# Patient Record
Sex: Female | Born: 1955 | Race: Black or African American | Hispanic: No | Smoking: Current every day smoker
Health system: Southern US, Community
[De-identification: ages and names within clinical notes are randomized; demographics above are authoritative.]

## PROBLEM LIST (undated history)

## (undated) DIAGNOSIS — I10 Essential (primary) hypertension: Secondary | ICD-10-CM

## (undated) DIAGNOSIS — F32A Depression, unspecified: Secondary | ICD-10-CM

## (undated) DIAGNOSIS — E785 Hyperlipidemia, unspecified: Secondary | ICD-10-CM

## (undated) DIAGNOSIS — G479 Sleep disorder, unspecified: Secondary | ICD-10-CM

## (undated) DIAGNOSIS — F329 Major depressive disorder, single episode, unspecified: Secondary | ICD-10-CM

## (undated) DIAGNOSIS — C539 Malignant neoplasm of cervix uteri, unspecified: Secondary | ICD-10-CM

## (undated) HISTORY — PX: ABDOMINAL HYSTERECTOMY: SHX81

## (undated) HISTORY — PX: BACK SURGERY: SHX140

## (undated) HISTORY — PX: TONSILLECTOMY: SUR1361

## (undated) HISTORY — DX: Sleep disorder, unspecified: G47.9

## (undated) HISTORY — PX: CERVICAL DISC SURGERY: SHX588

## (undated) HISTORY — DX: Essential (primary) hypertension: I10

## (undated) HISTORY — DX: Hyperlipidemia, unspecified: E78.5

## (undated) HISTORY — PX: OTHER SURGICAL HISTORY: SHX169

## (undated) HISTORY — PX: ELBOW SURGERY: SHX618

## (undated) HISTORY — DX: Depression, unspecified: F32.A

## (undated) HISTORY — DX: Major depressive disorder, single episode, unspecified: F32.9

## (undated) HISTORY — DX: Malignant neoplasm of cervix uteri, unspecified: C53.9

---

## 1981-11-17 DIAGNOSIS — C539 Malignant neoplasm of cervix uteri, unspecified: Secondary | ICD-10-CM

## 1981-11-17 HISTORY — DX: Malignant neoplasm of cervix uteri, unspecified: C53.9

## 2001-11-04 ENCOUNTER — Encounter: Admission: RE | Admit: 2001-11-04 | Discharge: 2001-11-04 | Payer: Self-pay | Admitting: Internal Medicine

## 2001-11-04 ENCOUNTER — Encounter: Payer: Self-pay | Admitting: Internal Medicine

## 2001-12-03 ENCOUNTER — Encounter (INDEPENDENT_AMBULATORY_CARE_PROVIDER_SITE_OTHER): Payer: Self-pay | Admitting: Specialist

## 2001-12-03 ENCOUNTER — Ambulatory Visit (HOSPITAL_COMMUNITY): Admission: RE | Admit: 2001-12-03 | Discharge: 2001-12-03 | Payer: Self-pay | Admitting: *Deleted

## 2001-12-15 ENCOUNTER — Encounter: Admission: RE | Admit: 2001-12-15 | Discharge: 2001-12-15 | Payer: Self-pay | Admitting: Internal Medicine

## 2001-12-15 ENCOUNTER — Encounter: Payer: Self-pay | Admitting: Internal Medicine

## 2005-05-22 ENCOUNTER — Emergency Department (HOSPITAL_COMMUNITY): Admission: EM | Admit: 2005-05-22 | Discharge: 2005-05-22 | Payer: Self-pay | Admitting: Emergency Medicine

## 2006-08-31 ENCOUNTER — Ambulatory Visit: Payer: Self-pay | Admitting: Family Medicine

## 2006-12-18 ENCOUNTER — Ambulatory Visit: Payer: Self-pay | Admitting: Family Medicine

## 2006-12-21 ENCOUNTER — Ambulatory Visit: Payer: Self-pay | Admitting: Family Medicine

## 2006-12-30 ENCOUNTER — Ambulatory Visit: Payer: Self-pay | Admitting: Family Medicine

## 2007-05-28 ENCOUNTER — Ambulatory Visit: Payer: Self-pay | Admitting: Family Medicine

## 2007-09-17 ENCOUNTER — Emergency Department (HOSPITAL_COMMUNITY): Admission: EM | Admit: 2007-09-17 | Discharge: 2007-09-17 | Payer: Self-pay | Admitting: Emergency Medicine

## 2007-12-24 ENCOUNTER — Ambulatory Visit: Payer: Self-pay | Admitting: Family Medicine

## 2008-04-14 ENCOUNTER — Ambulatory Visit: Payer: Self-pay | Admitting: Family Medicine

## 2008-09-11 ENCOUNTER — Ambulatory Visit: Payer: Self-pay | Admitting: Family Medicine

## 2008-10-09 ENCOUNTER — Ambulatory Visit: Payer: Self-pay | Admitting: Family Medicine

## 2009-05-17 ENCOUNTER — Ambulatory Visit: Payer: Self-pay | Admitting: Family Medicine

## 2009-06-11 ENCOUNTER — Ambulatory Visit: Payer: Self-pay | Admitting: Family Medicine

## 2010-01-07 ENCOUNTER — Ambulatory Visit: Payer: Self-pay | Admitting: Family Medicine

## 2010-01-28 ENCOUNTER — Ambulatory Visit: Payer: Self-pay | Admitting: Family Medicine

## 2010-04-08 ENCOUNTER — Ambulatory Visit: Payer: Self-pay | Admitting: Family Medicine

## 2010-04-12 ENCOUNTER — Encounter (INDEPENDENT_AMBULATORY_CARE_PROVIDER_SITE_OTHER): Payer: Self-pay

## 2010-09-19 ENCOUNTER — Ambulatory Visit: Payer: Self-pay | Admitting: Family Medicine

## 2010-10-24 ENCOUNTER — Inpatient Hospital Stay (HOSPITAL_COMMUNITY): Admission: EM | Admit: 2010-10-24 | Discharge: 2010-04-18 | Payer: Self-pay | Admitting: Emergency Medicine

## 2011-02-03 LAB — DIFFERENTIAL
Basophils Absolute: 0 10*3/uL (ref 0.0–0.1)
Basophils Relative: 0 % (ref 0–1)
Eosinophils Absolute: 0.1 10*3/uL (ref 0.0–0.7)
Eosinophils Relative: 1 % (ref 0–5)
Eosinophils Relative: 2 % (ref 0–5)
Lymphocytes Relative: 13 % (ref 12–46)
Lymphs Abs: 1.2 10*3/uL (ref 0.7–4.0)
Monocytes Absolute: 0.3 10*3/uL (ref 0.1–1.0)
Neutro Abs: 7.2 10*3/uL (ref 1.7–7.7)

## 2011-02-03 LAB — CBC
HCT: 29.6 % — ABNORMAL LOW (ref 36.0–46.0)
HCT: 32.3 % — ABNORMAL LOW (ref 36.0–46.0)
HCT: 41 % (ref 36.0–46.0)
HCT: 41 % (ref 36.0–46.0)
HCT: 43.2 % (ref 36.0–46.0)
Hemoglobin: 11.3 g/dL — ABNORMAL LOW (ref 12.0–15.0)
Hemoglobin: 12.9 g/dL (ref 12.0–15.0)
Hemoglobin: 14 g/dL (ref 12.0–15.0)
Hemoglobin: 14.1 g/dL (ref 12.0–15.0)
MCHC: 34.2 g/dL (ref 30.0–36.0)
MCHC: 34.2 g/dL (ref 30.0–36.0)
MCHC: 34.3 g/dL (ref 30.0–36.0)
MCHC: 34.4 g/dL (ref 30.0–36.0)
MCHC: 34.8 g/dL (ref 30.0–36.0)
MCV: 92 fL (ref 78.0–100.0)
MCV: 92.1 fL (ref 78.0–100.0)
MCV: 92.4 fL (ref 78.0–100.0)
MCV: 93 fL (ref 78.0–100.0)
MCV: 93.3 fL (ref 78.0–100.0)
Platelets: 314 10*3/uL (ref 150–400)
Platelets: 372 10*3/uL (ref 150–400)
RBC: 3.21 MIL/uL — ABNORMAL LOW (ref 3.87–5.11)
RBC: 3.93 MIL/uL (ref 3.87–5.11)
RBC: 4.36 MIL/uL (ref 3.87–5.11)
RBC: 4.39 MIL/uL (ref 3.87–5.11)
RBC: 4.41 MIL/uL (ref 3.87–5.11)
RBC: 4.69 MIL/uL (ref 3.87–5.11)
RDW: 12.4 % (ref 11.5–15.5)
RDW: 12.4 % (ref 11.5–15.5)
RDW: 12.8 % (ref 11.5–15.5)
WBC: 6 10*3/uL (ref 4.0–10.5)
WBC: 9.4 10*3/uL (ref 4.0–10.5)

## 2011-02-03 LAB — GLUCOSE, CAPILLARY
Glucose-Capillary: 104 mg/dL — ABNORMAL HIGH (ref 70–99)
Glucose-Capillary: 106 mg/dL — ABNORMAL HIGH (ref 70–99)
Glucose-Capillary: 106 mg/dL — ABNORMAL HIGH (ref 70–99)
Glucose-Capillary: 110 mg/dL — ABNORMAL HIGH (ref 70–99)
Glucose-Capillary: 116 mg/dL — ABNORMAL HIGH (ref 70–99)
Glucose-Capillary: 117 mg/dL — ABNORMAL HIGH (ref 70–99)
Glucose-Capillary: 118 mg/dL — ABNORMAL HIGH (ref 70–99)
Glucose-Capillary: 119 mg/dL — ABNORMAL HIGH (ref 70–99)
Glucose-Capillary: 120 mg/dL — ABNORMAL HIGH (ref 70–99)
Glucose-Capillary: 121 mg/dL — ABNORMAL HIGH (ref 70–99)
Glucose-Capillary: 122 mg/dL — ABNORMAL HIGH (ref 70–99)
Glucose-Capillary: 126 mg/dL — ABNORMAL HIGH (ref 70–99)
Glucose-Capillary: 131 mg/dL — ABNORMAL HIGH (ref 70–99)
Glucose-Capillary: 138 mg/dL — ABNORMAL HIGH (ref 70–99)
Glucose-Capillary: 146 mg/dL — ABNORMAL HIGH (ref 70–99)
Glucose-Capillary: 81 mg/dL (ref 70–99)
Glucose-Capillary: 89 mg/dL (ref 70–99)
Glucose-Capillary: 90 mg/dL (ref 70–99)
Glucose-Capillary: 92 mg/dL (ref 70–99)
Glucose-Capillary: 98 mg/dL (ref 70–99)

## 2011-02-03 LAB — POCT CARDIAC MARKERS
Myoglobin, poc: 53.9 ng/mL (ref 12–200)
Troponin i, poc: <0.05 ng/mL (ref 0.00–0.09)

## 2011-02-03 LAB — BASIC METABOLIC PANEL
BUN: 2 mg/dL — ABNORMAL LOW (ref 6–23)
CO2: 28 mEq/L (ref 19–32)
CO2: 29 mEq/L (ref 19–32)
Calcium: 8 mg/dL — ABNORMAL LOW (ref 8.4–10.5)
Calcium: 8.4 mg/dL (ref 8.4–10.5)
Chloride: 102 mEq/L (ref 96–112)
Chloride: 107 mEq/L (ref 96–112)
Creatinine, Ser: 0.6 mg/dL (ref 0.4–1.2)
Creatinine, Ser: 0.77 mg/dL (ref 0.4–1.2)
GFR calc Af Amer: >60 mL/min (ref >60)
GFR calc non Af Amer: >60 mL/min (ref >60)
Glucose, Bld: 117 mg/dL — ABNORMAL HIGH (ref 70–99)
Potassium: 3.7 mEq/L (ref 3.5–5.1)
Potassium: 3.8 mEq/L (ref 3.5–5.1)
Sodium: 136 mEq/L (ref 135–145)
Sodium: 141 mEq/L (ref 135–145)

## 2011-02-03 LAB — COMPREHENSIVE METABOLIC PANEL
AST: 20 U/L (ref 0–37)
AST: 22 U/L (ref 0–37)
Alkaline Phosphatase: 62 U/L (ref 39–117)
CO2: 26 mEq/L (ref 19–32)
CO2: 27 mEq/L (ref 19–32)
Calcium: 9.2 mg/dL (ref 8.4–10.5)
Chloride: 106 mEq/L (ref 96–112)
Creatinine, Ser: 0.72 mg/dL (ref 0.4–1.2)
GFR calc Af Amer: >60 mL/min (ref >60)
Glucose, Bld: 85 mg/dL (ref 70–99)
Glucose, Bld: 97 mg/dL (ref 70–99)
Potassium: 3.8 mEq/L (ref 3.5–5.1)
Sodium: 140 mEq/L (ref 135–145)

## 2011-02-03 LAB — LIPASE, BLOOD: Lipase: 40 U/L (ref 11–59)

## 2011-02-03 LAB — CULTURE, BLOOD (ROUTINE X 2)
Culture: NO GROWTH
Culture: NO GROWTH

## 2011-02-03 LAB — LACTIC ACID, PLASMA: Lactic Acid, Venous: 0.7 mmol/L (ref 0.5–2.2)

## 2011-02-28 ENCOUNTER — Ambulatory Visit (INDEPENDENT_AMBULATORY_CARE_PROVIDER_SITE_OTHER): Payer: 59 | Admitting: Family Medicine

## 2011-02-28 DIAGNOSIS — I499 Cardiac arrhythmia, unspecified: Secondary | ICD-10-CM

## 2011-02-28 DIAGNOSIS — F329 Major depressive disorder, single episode, unspecified: Secondary | ICD-10-CM

## 2011-04-08 ENCOUNTER — Institutional Professional Consult (permissible substitution): Payer: 59 | Admitting: Family Medicine

## 2011-04-08 ENCOUNTER — Encounter: Payer: Self-pay | Admitting: Family Medicine

## 2011-04-08 ENCOUNTER — Ambulatory Visit (INDEPENDENT_AMBULATORY_CARE_PROVIDER_SITE_OTHER): Payer: 59 | Admitting: Family Medicine

## 2011-04-08 ENCOUNTER — Telehealth: Payer: Self-pay

## 2011-04-08 VITALS — BP 140/88 | HR 64 | Wt 148.0 lb

## 2011-04-08 DIAGNOSIS — I499 Cardiac arrhythmia, unspecified: Secondary | ICD-10-CM

## 2011-04-08 DIAGNOSIS — Z87891 Personal history of nicotine dependence: Secondary | ICD-10-CM

## 2011-04-08 NOTE — Patient Instructions (Signed)
Cut back on her caffeine. Keep working on the smoking. Work on giving her self positive things to do. Call [phone] quit now to get help her at return here if further get rid of your negative programming

## 2011-04-08 NOTE — Progress Notes (Signed)
  Subjective:    Patient ID: Maria Dougherty, female    DOB: July 29, 1956, 55 y.o.   MRN: [id number]  HPI she is here for consult concerning recent event monitor. She does admit to drinking excessive amounts of coffee. She is not on any decongestants. She also has concerns over his smoking. She is now down to 4 cigarettes per day.  Event monitor did show PACs and PVCs but no sustained arrhythmia.  Review of Systems     Objective:   Physical Exam Alert and in no distress otherwise not examined       Assessment & Plan:  PACs and PVCs. Cigarette abuse. Recommend she cut back on caffeine and see how this affects her heart rhythm. We discussed the possible use of beta blockers if she continues to have difficulty with her arrhythmia. We also discussed smoking cessation in detail. Strongly encouraged her to call the toll-free number and work on the habit issues of smoking.

## 2011-04-08 NOTE — Telephone Encounter (Signed)
Pt is coming in today to discuss event monitor with the Dr.

## 2011-05-27 ENCOUNTER — Encounter (INDEPENDENT_AMBULATORY_CARE_PROVIDER_SITE_OTHER): Payer: Self-pay | Admitting: Surgery

## 2011-05-28 ENCOUNTER — Encounter (INDEPENDENT_AMBULATORY_CARE_PROVIDER_SITE_OTHER): Payer: Self-pay | Admitting: Surgery

## 2011-05-28 ENCOUNTER — Ambulatory Visit (INDEPENDENT_AMBULATORY_CARE_PROVIDER_SITE_OTHER): Payer: 59 | Admitting: Surgery

## 2011-05-28 VITALS — BP 170/80 | HR 80 | Temp 96.2°F | Ht 63.5 in | Wt 149.0 lb

## 2011-05-28 DIAGNOSIS — K432 Incisional hernia without obstruction or gangrene: Secondary | ICD-10-CM | POA: Insufficient documentation

## 2011-05-28 NOTE — Progress Notes (Signed)
Subjective:     Patient ID: Maria Dougherty, female   DOB: 1956/07/31, 55 y.o.   MRN: [id number]    BP 170/80  Pulse 80  Temp 96.2 F (35.7 C)  Ht 5' 3.5" (1.613 m)  Wt 149 lb (67.586 kg)  BMI 25.98 kg/m2    HPI This is a 55 year old female that I operated on last year for a bowel obstruction. She has now noticed a bulge in her lower abdomen for several months now. It is getting larger and causing her to have some crampy abdominal pain. Other than that she has no obstructive symptoms. She denies nausea or vomiting. She is moving her bowels well. She has no other complaints. Past Medical History  Diagnosis Date  . Hypertension   . Hyperlipidemia   . Depression   . Sleep difficulties     Past Surgical History  Procedure Date  . Cervical disc surgery   . Back surgery   . Elbow surgery     rt elbow  . Abdominal hysterectomy   . Bowel obstruction     Allergies  Allergen Reactions  . Codeine    Current outpatient prescriptions:DULoxetine (CYMBALTA) 60 MG capsule, Take 60 mg by mouth daily.  , Disp: , Rfl: ;  traZODone (DESYREL) 100 MG tablet, Take 100 mg by mouth at bedtime.  , Disp: , Rfl:   History   Social History  . Marital Status: Single    Spouse Name: N/A    Number of Children: N/A  . Years of Education: N/A   Occupational History  . Not on file.   Social History Main Topics  . Smoking status: Current Everyday Smoker -- 0.2 packs/day for 10 years    Types: Cigarettes  . Smokeless tobacco: Never Used  . Alcohol Use: 0.0 oz/week    2-3 Glasses of wine per week  . Drug Use: No  . Sexually Active: Not on file   Other Topics Concern  . Not on file   Social History Narrative  . No narrative on file     Review of Systems  Constitutional: Negative.   HENT: Negative.   Eyes: Negative.   Respiratory: Negative.   Cardiovascular: Negative.   Gastrointestinal: Negative.   Genitourinary: Negative.   Musculoskeletal: Negative.   Skin: Negative.     Neurological: Negative.   Hematological: Negative.   Psychiatric/Behavioral: Negative.        Objective:   Physical Exam  Constitutional: She is oriented to person, place, and time. She appears well-developed and well-nourished. No distress.  HENT:  Head: Normocephalic and atraumatic.  Right Ear: External ear normal.  Left Ear: External ear normal.  Nose: Nose normal.  Mouth/Throat: Oropharynx is clear and moist. No oropharyngeal exudate.  Eyes: Pupils are equal, round, and reactive to light. No scleral icterus.  Neck: Normal range of motion. Neck supple. No tracheal deviation present. No thyromegaly present.  Cardiovascular: Regular rhythm and normal heart sounds.   No murmur heard. Pulmonary/Chest: Effort normal and breath sounds normal. No respiratory distress.  Abdominal: Soft. Normal appearance. She exhibits no distension. There is no tenderness. A hernia is present. Hernia confirmed positive in the ventral area.  Musculoskeletal: Normal range of motion. She exhibits no edema and no tenderness.  Lymphadenopathy:    She has no cervical adenopathy.  Neurological: She is alert and oriented to person, place, and time.  Skin: Skin is dry. No rash noted.  Psychiatric: She has a normal mood and affect. Her behavior is  normal.       Assessment:     This is a 55 year old female with a reducible ventral incisional hernia.   Plan:       I discussed this with the patient in detail. I would recommend repair with mesh as it is symptomatic. I discussed both laparoscopic and open techniques. We will proceed with laparoscopic repair with mesh. I discussed the risk of the surgery which include but are not limited to bleeding, infection, injury to surround structures including the intestines, need for conversion to an open procedure, use of mesh, recurrence, etc. she understands and wishes to proceed. Surgery will be scheduled.

## 2011-06-16 ENCOUNTER — Ambulatory Visit (HOSPITAL_COMMUNITY)
Admission: RE | Admit: 2011-06-16 | Discharge: 2011-06-16 | Disposition: A | Discharge status: Home / Self Care | Payer: 59 | Source: Ambulatory Visit | Attending: Surgery | Admitting: Surgery

## 2011-06-16 ENCOUNTER — Other Ambulatory Visit (INDEPENDENT_AMBULATORY_CARE_PROVIDER_SITE_OTHER): Payer: Self-pay | Admitting: Surgery

## 2011-06-16 ENCOUNTER — Encounter (HOSPITAL_COMMUNITY)
Admission: RE | Admit: 2011-06-16 | Discharge: 2011-06-16 | Disposition: A | Discharge status: Home / Self Care | Payer: 59 | Source: Ambulatory Visit | Attending: Surgery | Admitting: Surgery

## 2011-06-16 DIAGNOSIS — Z01811 Encounter for preprocedural respiratory examination: Secondary | ICD-10-CM

## 2011-06-16 DIAGNOSIS — Z01812 Encounter for preprocedural laboratory examination: Secondary | ICD-10-CM | POA: Insufficient documentation

## 2011-06-16 DIAGNOSIS — Z01818 Encounter for other preprocedural examination: Secondary | ICD-10-CM | POA: Insufficient documentation

## 2011-06-16 LAB — BASIC METABOLIC PANEL
CO2: 31 mEq/L (ref 19–32)
Chloride: 99 mEq/L (ref 96–112)
Glucose, Bld: 90 mg/dL (ref 70–99)
Potassium: 4.6 mEq/L (ref 3.5–5.1)
Sodium: 138 mEq/L (ref 135–145)

## 2011-06-16 LAB — CBC
Hemoglobin: 15.2 g/dL — ABNORMAL HIGH (ref 12.0–15.0)
MCH: 31.4 pg (ref 26.0–34.0)
RBC: 4.84 MIL/uL (ref 3.87–5.11)

## 2011-06-16 LAB — SURGICAL PCR SCREEN: Staphylococcus aureus: NEGATIVE

## 2011-06-20 ENCOUNTER — Inpatient Hospital Stay (HOSPITAL_COMMUNITY)
Admission: RE | Admit: 2011-06-20 | Discharge: 2011-06-24 | DRG: 355 | Disposition: A | Discharge status: Home / Self Care | Payer: 59 | Source: Ambulatory Visit | Attending: Surgery | Admitting: Surgery

## 2011-06-20 DIAGNOSIS — K432 Incisional hernia without obstruction or gangrene: Principal | ICD-10-CM | POA: Diagnosis present

## 2011-06-20 DIAGNOSIS — K660 Peritoneal adhesions (postprocedural) (postinfection): Secondary | ICD-10-CM

## 2011-06-20 DIAGNOSIS — F17200 Nicotine dependence, unspecified, uncomplicated: Secondary | ICD-10-CM | POA: Diagnosis present

## 2011-06-20 DIAGNOSIS — I10 Essential (primary) hypertension: Secondary | ICD-10-CM | POA: Diagnosis present

## 2011-06-20 DIAGNOSIS — K219 Gastro-esophageal reflux disease without esophagitis: Secondary | ICD-10-CM | POA: Diagnosis present

## 2011-06-20 HISTORY — PX: HERNIA REPAIR: SHX51

## 2011-06-23 LAB — URINALYSIS, ROUTINE W REFLEX MICROSCOPIC
Bilirubin Urine: NEGATIVE
Glucose, UA: NEGATIVE mg/dL
Glucose, UA: NEGATIVE mg/dL
Hgb urine dipstick: NEGATIVE
Ketones, ur: NEGATIVE mg/dL
Nitrite: NEGATIVE
Protein, ur: NEGATIVE mg/dL
Specific Gravity, Urine: 1.017 (ref 1.005–1.030)
pH: 7 (ref 5.0–8.0)

## 2011-06-23 LAB — URINE MICROSCOPIC-ADD ON

## 2011-06-25 ENCOUNTER — Telehealth (INDEPENDENT_AMBULATORY_CARE_PROVIDER_SITE_OTHER): Payer: Self-pay | Admitting: Surgery

## 2011-06-25 LAB — URINE CULTURE
Colony Count: >=100000
Culture  Setup Time: 201208061720

## 2011-06-25 NOTE — Op Note (Signed)
[name], CASINO NO.:  [id number]  MEDICAL RECORD NO.:  [id number]  LOCATION:  5120                         FACILITY:  [facility]  PHYSICIAN:  Abigail Miyamoto, M.D. DATE OF BIRTH:  1956-01-20  DATE OF PROCEDURE:  06/20/2011 DATE OF DISCHARGE:                              OPERATIVE REPORT   PREOPERATIVE DIAGNOSIS:  Ventral incisional hernia.  POSTOPERATIVE DIAGNOSIS:  Ventral incisional hernia.  PROCEDURE:  Laparoscopic ventral incisional hernia repair with mesh (15 cm x 20 cm Physiomesh).  SURGEON:  Abigail Miyamoto, MD  ANESTHESIA:  General and 0.5% Marcaine.  ESTIMATED BLOOD LOSS:  Minimum.  FINDINGS:  The patient was found to have a large amount of adhesions of omentum to the abdominal wall.  The hernia contained only omentum and no bowel that was repaired with a 15-cm right, and 20-cm piece of Physiomesh.  PROCEDURE IN DETAIL:  The patient was brought to the operative room and identified as Maria Dougherty.  She was placed supine on the operative table and general anesthesia was induced.  Her abdomen was then prepped and draped in usual sterile fashion.  A small incision was then made in the patient's left upper quadrant with a scalpel.  I then used the 5-mm OptiVu port and camera to go through all layers of muscle and fascia under direct vision slowly until I gained entrance into the peritoneal cavity.  Insufflation was then begun.  I again reinserted the camera and confirmed that was in the abdominal cavity and there was no evidence of injury of the intestines.  I then placed 5-mm port in the patient's left lower quadrant and another in the right upper quadrant under direct vision.  The patient had a large amount of adhesions over omentum to the abdominal wall.  It took approximately 45 minutes to lax these adhesions with both blunt dissection, the Harmonic scalpel as well as the scissors.  No bowel was involved in the hernia.  I also had to  use surgical clips, one adhesive band in order to gain hemostasis.  When I was finally able to free up all the omentum from the abdominal wall, the patient had a moderate-sized hernia defect just above the pubis as well as other small holes in the midline incision.  I measured the area appropriately.  I then brought a 15-cm x 20-cm piece of Physiomesh onto the field.  I placed three separate sutures consisting of #1 Novafil in three different areas of the mesh.  I then rolled the mesh up.  I changed the left upper quadrant port for a 5 mm to a 12-mm port.  I then placed the mesh through this port and into the abdominal cavity.  I then unrolled the mesh under direct vision.  I then made three separate stab incisions and using the suture passer, I was able to pull all the sutures up through the abdominal wall.  I then tied these in place securing the mesh to the peritoneal surface.  I then used the SecureStrap absorbable tacker to tack the mesh circumferentially.  I had to do this to the pubis at the midline in the lower abdomen in order to get the mesh beyond the hernia  defect.  Excellent wide coverage of the hernia appeared to be achieved in all directions.  I then evaluate the abdominal cavity again, hemostasis appeared to be achieved and there was no evidence of bowel injury.  All ports were removed under direct vision and the abdomen was deflated.  All incisions were then anesthetized with Marcaine and closed with 4-0 Monocryl subcuticular sutures.  Steri-Strips and Band-Aids were then applied. The patient tolerated the procedure well.  All counts were correct at the end of the procedure.  The patient was then extubated in the operating room and taken in stable condition to the recovery room.     Abigail Miyamoto, M.D.     DB/MEDQ  D:  06/20/2011  T:  06/20/2011  Job:  [id number]  Electronically Signed by Abigail Miyamoto M.D. on 06/25/2011 10:09:01 AM

## 2011-07-07 ENCOUNTER — Ambulatory Visit (INDEPENDENT_AMBULATORY_CARE_PROVIDER_SITE_OTHER): Payer: 59 | Admitting: Surgery

## 2011-07-07 ENCOUNTER — Encounter (INDEPENDENT_AMBULATORY_CARE_PROVIDER_SITE_OTHER): Payer: Self-pay | Admitting: Surgery

## 2011-07-07 VITALS — BP 136/80 | HR 66 | Temp 97.4°F | Ht 63.5 in | Wt 146.8 lb

## 2011-07-07 DIAGNOSIS — K432 Incisional hernia without obstruction or gangrene: Secondary | ICD-10-CM

## 2011-07-07 DIAGNOSIS — IMO0002 Reserved for concepts with insufficient information to code with codable children: Secondary | ICD-10-CM | POA: Insufficient documentation

## 2011-07-07 NOTE — Progress Notes (Signed)
Subjective:     Patient ID: Maria Dougherty, female   DOB: Jun 09, 1956, 55 y.o.   MRN: [id number]  HPI  : Ventral hernia repair fit/01/2011  Reason visit: Worsening pain.  The patient comes it today with concerns of pain after surgery. She called over the weekend two days ago, & I talked to her.  Her main issue was pain. She was not turn out. She does have regular bowel movements. She was concerned about a lump on her abdomen.  I recommended she increase nonnarcotic pain control and followup with her surgeon this week.  She is to see Dr. Alicia Amel tomorrow. She cannot wait. She notes that she still having rather sore. She is not having nausea & vomiting. She stopped taking any medications that she was hoping she would not need any she was worried about some redness at the lower part of her incision but that seems to have gone away.  Review of Systems  Constitutional: Negative for fever, chills, diaphoresis, appetite change and fatigue.  HENT: Negative for ear pain, sore throat, trouble swallowing, neck pain and ear discharge.   Eyes: Negative for photophobia, discharge and visual disturbance.  Respiratory: Negative for cough, choking, chest tightness and shortness of breath.   Cardiovascular: Negative for chest pain and palpitations.  Gastrointestinal: Negative for nausea, vomiting, diarrhea, constipation, blood in stool, anal bleeding and rectal pain.  Genitourinary: Negative for dysuria, frequency and difficulty urinating.  Musculoskeletal: Negative for myalgias and gait problem.  Skin: Negative for color change, pallor and rash.  Neurological: Negative for dizziness, speech difficulty, weakness and numbness.  Hematological: Negative for adenopathy.  Psychiatric/Behavioral: Negative for confusion and agitation. The patient is not nervous/anxious.        Objective:   Physical Exam  Constitutional: She is oriented to person, place, and time. She appears well-developed and  well-nourished. No distress.       Calm, relaxed.  Well groomed  HENT:  Head: Normocephalic.  Mouth/Throat: Oropharynx is clear and moist. No oropharyngeal exudate.  Eyes: Conjunctivae and EOM are normal. Pupils are equal, round, and reactive to light. No scleral icterus.  Neck: Normal range of motion. Neck supple. No tracheal deviation present.  Cardiovascular: Normal rate, regular rhythm and intact distal pulses.   Pulmonary/Chest: Effort normal and breath sounds normal. No respiratory distress. She exhibits no tenderness.  Abdominal: Soft. She exhibits no distension. Hernia confirmed negative in the right inguinal area and confirmed negative in the left inguinal area.       Mild diffuse tenderness. No peritonitis. Laparoscopic incisions closed well healed.  Suprapubically, she has a 4 x 4 centimeter spherical mass. Consistent with postoperative stroma. Not particularly tender. No heat or erythema associated with it.  Genitourinary: No vaginal discharge found.  Musculoskeletal: Normal range of motion. She exhibits no tenderness.  Lymphadenopathy:    She has no cervical adenopathy.       Right: No inguinal adenopathy present.       Left: No inguinal adenopathy present.  Neurological: She is alert and oriented to person, place, and time. No cranial nerve deficit. She exhibits normal muscle tone. Coordination normal.  Skin: Skin is warm and dry. No rash noted. She is not diaphoretic. No erythema.  Psychiatric: She has a normal mood and affect. Her behavior is normal. Judgment and thought content normal.       Assessment:    3 weeks s/p lap VWH repair with seroma & soreness    Plan:     I  strongly recommend she get a background pain regimen. I gave her handout on this. Recommend she do ice, heat, Aleve, & prescription pain medicine. Recommend she try a binder. This is what is recommended 2 days ago. She notes she didn't wnat to do that until she saw somebody.  I noted that her ventral  hernias can be to be sore for several months, especially if she if it larger mesh is needed to help close the defect.  She feels reassured. She is no evidence of infection or other issues. She will followup with Dr. Magnus Ivan soon.    STOP SMOKING!

## 2011-07-07 NOTE — Patient Instructions (Signed)

## 2011-07-08 ENCOUNTER — Ambulatory Visit (INDEPENDENT_AMBULATORY_CARE_PROVIDER_SITE_OTHER): Payer: 59 | Admitting: Surgery

## 2011-07-08 ENCOUNTER — Encounter (INDEPENDENT_AMBULATORY_CARE_PROVIDER_SITE_OTHER): Payer: Self-pay | Admitting: Surgery

## 2011-07-08 DIAGNOSIS — Z09 Encounter for follow-up examination after completed treatment for conditions other than malignant neoplasm: Secondary | ICD-10-CM

## 2011-07-08 NOTE — Progress Notes (Signed)
Subjective:     Patient ID: Maria Dougherty, female   DOB: 1956-10-31, 55 y.o.   MRN: [id number]  HPI  She is here for her postoperative visit status post left ventral incisional hernia repair with mesh. Her course was complicated with postoperative urinary retention and urinary tract infection. She has now finished her antibiotics. She did have some crampy abdominal pain and low-grade fever this weekend. She saw Dr. gross yesterday. She does feel better. She has no nausea or vomiting. Her bowel movements are improving. Review of Systems     Objective:   Physical Exam On exam, her incisions are well healed. Her abdomen is flat and nontender. There is no evidence of recurrent hernia    Assessment:     Patient status post laparoscopic incisional hernia repair with mesh    Plan:     She will stay out of work for one more week. I will see her at that time determine whether she is ready for return. Should her symptoms return I made to get need to get a CAT scan of the abdomen.

## 2011-07-16 NOTE — Discharge Summary (Signed)
  NAMEHERBERT, Maria Dougherty NO.:  [id number]  MEDICAL RECORD NO.:  [id number]  LOCATION:  5120                         FACILITY:  [facility]  PHYSICIAN:  Abigail Miyamoto, M.D. DATE OF BIRTH:  04-20-1956  DATE OF ADMISSION:  06/20/2011 DATE OF DISCHARGE:  06/24/2011                              DISCHARGE SUMMARY   DISCHARGE DIAGNOSIS:  Ventral incisional hernia, status post laparoscopic ventral incisional hernia repair with mesh.  HISTORY:  This is a 55 year old female, who presents with asymptomatic ventral incisional hernia, she presents for elective laparoscopic repair.  HOSPITAL COURSE:  The patient was admitted and taken to the operating room where she underwent a laparoscopic ventral incisional hernia repair with mesh.  She was taken in stable condition to regular surgical floor. She had an uneventful postoperative recovery.  She required significant pain medication and required more than one in the hospital.  On postop day #3, she was feeling better, but she did have postoperative urinary retention and Foley catheter had to be placed.  Urinalysis was significant for urinary tract infection.  By June 24, 2011, she was doing much better.  She was voiding well with the catheter removed. Decision was made to discharge the patient home on Bactrim.  DISCHARGE DIET:  Regular.  DISCHARGE ACTIVITY:  She will do no heavy lifting.  MEDICATIONS:  Please see med rec.  She may shower.  DISCHARGE FOLLOWUP:  She will follow up with [facility] in 2 weeks post discharge.     Abigail Miyamoto, M.D.     DB/MEDQ  D:  07/06/2011  T:  07/06/2011  Job:  [id number]  Electronically Signed by Abigail Miyamoto M.D. on 07/16/2011 07:43:45 PM

## 2011-08-01 ENCOUNTER — Encounter (INDEPENDENT_AMBULATORY_CARE_PROVIDER_SITE_OTHER): Payer: Self-pay | Admitting: Surgery

## 2011-08-01 ENCOUNTER — Ambulatory Visit (INDEPENDENT_AMBULATORY_CARE_PROVIDER_SITE_OTHER): Payer: 59 | Admitting: Surgery

## 2011-08-01 VITALS — BP 160/90 | HR 80

## 2011-08-01 DIAGNOSIS — Z09 Encounter for follow-up examination after completed treatment for conditions other than malignant neoplasm: Secondary | ICD-10-CM

## 2011-08-01 NOTE — Progress Notes (Signed)
Subjective:     Patient ID: Maria Dougherty, female   DOB: [birth year], 55 y.o.   MRN: [id number]  HPI  She is here for one final postoperative visit. She is doing well and has no complaints. Her abdominal symptoms and urinary symptoms have resolved. Review of Systems     Objective:   Physical Exam On examination, her incisions are well healed and there is no evidence of recurrent hernia    Assessment:       Patient status post laparoscopic ventral incisional hernia repair Plan:     I wrote her a note to return to work on September 19. She will resume her other normal activities. I will see her back as needed.

## 2011-10-16 ENCOUNTER — Encounter: Payer: Self-pay | Admitting: Family Medicine

## 2012-01-12 ENCOUNTER — Ambulatory Visit (INDEPENDENT_AMBULATORY_CARE_PROVIDER_SITE_OTHER): Payer: 59 | Admitting: Family Medicine

## 2012-01-12 ENCOUNTER — Encounter: Payer: Self-pay | Admitting: Family Medicine

## 2012-01-12 VITALS — BP 136/90 | HR 70 | Wt 152.0 lb

## 2012-01-12 DIAGNOSIS — M719 Bursopathy, unspecified: Secondary | ICD-10-CM

## 2012-01-12 DIAGNOSIS — M7552 Bursitis of left shoulder: Secondary | ICD-10-CM

## 2012-01-12 DIAGNOSIS — M67919 Unspecified disorder of synovium and tendon, unspecified shoulder: Secondary | ICD-10-CM

## 2012-01-12 NOTE — Progress Notes (Signed)
  Subjective:    Patient ID: Maria Dougherty, female    DOB: 03-Jul-1956, 56 y.o.   MRN: [id number]  HPI She has a 3 month of shoulder pain,slowly getting worse. No history of injury. Pain is made worse with abduction and external rotation. No other joints are involved   Review of Systems     Objective:   Physical Exam Full motion of her shoulder with pain on abduction and external rotation. Negative sulcus sign. Drop arm test negative. Neer's and Hawkins test negative. No tenderness to palpation of the shoulder.       Assessment & Plan:  Right shoulder pain probable bursitis. This was discussed with the patient and she decided to have it injected. 40 mg of Kenalog and 2 cc of Xylocaine was injected into the left subacromial bursa without difficulty. She obtained relatively quick relief of her symptoms. Recommend she return here if continued difficulty.

## 2012-03-08 ENCOUNTER — Encounter: Payer: Self-pay | Admitting: Family Medicine

## 2012-03-08 ENCOUNTER — Ambulatory Visit (INDEPENDENT_AMBULATORY_CARE_PROVIDER_SITE_OTHER): Payer: 59 | Admitting: Family Medicine

## 2012-03-08 VITALS — BP 130/80 | HR 70 | Wt 150.0 lb

## 2012-03-08 DIAGNOSIS — M19019 Primary osteoarthritis, unspecified shoulder: Secondary | ICD-10-CM

## 2012-03-08 NOTE — Progress Notes (Signed)
  Subjective:    Patient ID: Maria Dougherty, female    DOB: May 12, 1956, 56 y.o.   MRN: [id number]  HPI She is here for recheck on left shoulder pain. She obtained only minimal relief with the injection. She now states that the pain is more anterior and point tender in nature.   Review of Systems     Objective:   Physical Exam Alert and in no distress. Tender to palpation over the left a.c. joint. Good motion of the shoulder without pain. No laxity noted.       Assessment & Plan:   1. Acromioclavicular joint arthritis  DG Shoulder Right   probable referral to orthopedics.

## 2012-06-11 ENCOUNTER — Encounter: Payer: Self-pay | Admitting: Medical

## 2012-06-11 ENCOUNTER — Ambulatory Visit (INDEPENDENT_AMBULATORY_CARE_PROVIDER_SITE_OTHER): Payer: 59 | Admitting: Medical

## 2012-06-11 VITALS — BP 140/84 | HR 60 | Temp 98.1°F | Resp 16 | Wt 145.0 lb

## 2012-06-11 DIAGNOSIS — J029 Acute pharyngitis, unspecified: Secondary | ICD-10-CM

## 2012-06-11 NOTE — Progress Notes (Signed)
Subjective:   HPI  Maria Dougherty is a 56 y.o. female who presents with c/o sore throat.  Sore throat x 3 days, worsening.  Today has painful swallowing on the right, right ear hurts.  Using tea with lemon and honey, chloraseptic cough drops.   No sick contacts.   No fever, nausea, vomiting, cough, abdominal pain.  No other aggravating or relieving factors.    No other c/o.  The following portions of the patient's history were reviewed and updated as appropriate: allergies, current medications, past family history, past medical history, past social history, past surgical history and problem list.  Past Medical History  Diagnosis Date  . Hypertension   . Hyperlipidemia   . Depression   . Sleep difficulties     Allergies  Allergen Reactions  . Codeine Itching    All over the body.  . Percocet (Oxycodone-Acetaminophen) Itching    All over body.     Review of Systems ROS reviewed and was negative other than noted in HPI or above.    Objective:   Physical Exam  General appearance: alert, no distress, WD/WN HEENT: normocephalic, sclerae anicteric, TMs pearly, nares patent, no discharge or erythema, pharynx normal Oral cavity: MMM, no lesions Neck: supple, shoddy tender right sided lymphadenopathy, no thyromegaly, no masses Heart: RRR, normal S1, S2, no murmurs Lungs: CTA bilaterally, no wheezes, rhonchi, or rales  Assessment and Plan :     Encounter Diagnosis  Name Primary?  . Pharyngitis Yes    Etiology most likley viral pharyngitis .  Advised supportive care.  She has left over amoxicillin at home from dentist.  Advised if symptoms worsen such as fever, moderate to severe ear pain, red throat or exudate as discussed, can begin amoxicillin, but for now her exam and symptoms suggestive viral etiology.

## 2012-06-11 NOTE — Patient Instructions (Signed)
Pharyngitis, Viral and Bacterial Pharyngitis is soreness (inflammation) or infection of the pharynx. It is also called a sore throat. CAUSES  Most sore throats are caused by viruses and are part of a cold. However, some sore throats are caused by strep and other bacteria. Sore throats can also be caused by post nasal drip from draining sinuses, allergies and sometimes from sleeping with an open mouth. Infectious sore throats can be spread from person to person by coughing, sneezing and sharing cups or eating utensils. TREATMENT  Sore throats that are viral usually last 3-4 days. Viral illness will get better without medications (antibiotics). Strep throat and other bacterial infections will usually begin to get better about 24-48 hours after you begin to take antibiotics. HOME CARE INSTRUCTIONS   If the caregiver feels there is a bacterial infection or if there is a positive strep test, they will prescribe an antibiotic. The full course of antibiotics must be taken. If the full course of antibiotic is not taken, you or your child may become ill again. If you or your child has strep throat and do not finish all of the medication, serious heart or kidney diseases may develop.   Drink enough water and fluids to keep your urine clear or pale yellow.   Only take over-the-counter or prescription medicines for pain, discomfort or fever as directed by your caregiver.   Get lots of rest.   Gargle with salt water ( tsp. of salt in a glass of water) as often as every 1-2 hours as you need for comfort.   Hard candies may soothe the throat if individual is not at risk for choking. Throat sprays or lozenges may also be used.  SEEK MEDICAL CARE IF:   Large, tender lumps in the neck develop.   A rash develops.   Green, yellow-brown or bloody sputum is coughed up.   Your baby is older than 3 months with a rectal temperature of 100.5 F (38.1 C) or higher for more than 1 day.  SEEK IMMEDIATE MEDICAL CARE  IF:   A stiff neck develops.   You or your child are drooling or unable to swallow liquids.   You or your child are vomiting, unable to keep medications or liquids down.   You or your child has severe pain, unrelieved with recommended medications.   You or your child are having difficulty breathing (not due to stuffy nose).   You or your child are unable to fully open your mouth.   You or your child develop redness, swelling, or severe pain anywhere on the neck.   You have a fever.   Your baby is older than 3 months with a rectal temperature of 102 F (38.9 C) or higher.   Your baby is 47 months old or younger with a rectal temperature of 100.4 F (38 C) or higher.  MAKE SURE YOU:   Understand these instructions.   Will watch your condition.   Will get help right away if you are not doing well or get worse.  Document Released: 11/03/2005 Document Revised: 10/23/2011 Document Reviewed: 01/31/2008 [facility] Patient Information 2012 [place], [place].   If worse over next 2 days such as fever, worse ear pain, red throat, swollen nodes, then begin the amoxicilin you have.

## 2012-06-13 ENCOUNTER — Encounter (HOSPITAL_COMMUNITY): Payer: Self-pay | Admitting: Emergency Medicine

## 2012-06-13 DIAGNOSIS — I10 Essential (primary) hypertension: Secondary | ICD-10-CM | POA: Insufficient documentation

## 2012-06-13 DIAGNOSIS — F17200 Nicotine dependence, unspecified, uncomplicated: Secondary | ICD-10-CM | POA: Insufficient documentation

## 2012-06-13 DIAGNOSIS — J36 Peritonsillar abscess: Secondary | ICD-10-CM | POA: Insufficient documentation

## 2012-06-13 DIAGNOSIS — J3501 Chronic tonsillitis: Secondary | ICD-10-CM | POA: Insufficient documentation

## 2012-06-13 MED ORDER — IBUPROFEN 800 MG PO TABS
800.0000 mg | ORAL_TABLET | Freq: Once | ORAL | Status: AC
  Administered 2012-06-13: 800 mg via ORAL
  Filled 2012-06-13: qty 1

## 2012-06-13 NOTE — ED Notes (Signed)
C/o sore throat since Wednesday.  Reports nausea, dizziness, and feeling like something is stuck in throat since this morning.

## 2012-06-14 ENCOUNTER — Encounter (HOSPITAL_COMMUNITY): Payer: Self-pay | Admitting: Anesthesiology

## 2012-06-14 ENCOUNTER — Ambulatory Visit (HOSPITAL_COMMUNITY)
Admission: EM | Admit: 2012-06-14 | Discharge: 2012-06-15 | Disposition: A | Discharge status: Home / Self Care | Payer: 59 | Attending: Otolaryngology | Admitting: Otolaryngology

## 2012-06-14 ENCOUNTER — Ambulatory Visit (HOSPITAL_COMMUNITY): Payer: 59 | Admitting: Anesthesiology

## 2012-06-14 ENCOUNTER — Emergency Department (HOSPITAL_COMMUNITY): Payer: 59

## 2012-06-14 ENCOUNTER — Encounter (HOSPITAL_COMMUNITY)
Admission: EM | Disposition: A | Discharge status: Home / Self Care | Payer: Self-pay | Source: Home / Self Care | Attending: Emergency Medicine

## 2012-06-14 ENCOUNTER — Emergency Department (HOSPITAL_COMMUNITY)
Admit: 2012-06-14 | Discharge: 2012-06-14 | Disposition: A | Discharge status: Home / Self Care | Payer: 59 | Attending: Emergency Medicine | Admitting: Emergency Medicine

## 2012-06-14 DIAGNOSIS — J36 Peritonsillar abscess: Secondary | ICD-10-CM

## 2012-06-14 HISTORY — PX: TONSILLECTOMY: SHX5217

## 2012-06-14 LAB — POCT I-STAT, CHEM 8
BUN: 4 mg/dL — ABNORMAL LOW (ref 6–23)
Calcium, Ion: 1.15 mmol/L (ref 1.12–1.23)
Creatinine, Ser: 0.9 mg/dL (ref 0.50–1.10)
TCO2: 26 mmol/L (ref 0–100)

## 2012-06-14 SURGERY — TONSILLECTOMY
Anesthesia: General | Site: Throat | Laterality: Bilateral | Wound class: Clean Contaminated

## 2012-06-14 MED ORDER — DEXAMETHASONE SODIUM PHOSPHATE 4 MG/ML IJ SOLN
INTRAMUSCULAR | Status: DC | PRN
  Administered 2012-06-14: 8 mg via INTRAVENOUS

## 2012-06-14 MED ORDER — OXYMETAZOLINE HCL 0.05 % NA SOLN
NASAL | Status: DC | PRN
  Administered 2012-06-14: 1

## 2012-06-14 MED ORDER — ACETAMINOPHEN 80 MG PO CHEW
320.0000 mg | CHEWABLE_TABLET | ORAL | Status: DC | PRN
  Filled 2012-06-14: qty 4

## 2012-06-14 MED ORDER — FENTANYL CITRATE 0.05 MG/ML IJ SOLN
25.0000 ug | INTRAMUSCULAR | Status: AC | PRN
  Administered 2012-06-14 (×2): 25 ug via INTRAVENOUS
  Filled 2012-06-14 (×2): qty 2

## 2012-06-14 MED ORDER — NAPROXEN 500 MG PO TABS
500.0000 mg | ORAL_TABLET | Freq: Three times a day (TID) | ORAL | Status: DC | PRN
  Filled 2012-06-14: qty 1

## 2012-06-14 MED ORDER — KETOROLAC TROMETHAMINE 30 MG/ML IJ SOLN: 15.0000 mg | Freq: Once | INTRAMUSCULAR | Status: DC | PRN

## 2012-06-14 MED ORDER — NAPROXEN SODIUM 275 MG PO TABS: 440.0000 mg | ORAL_TABLET | ORAL | Status: DC | PRN

## 2012-06-14 MED ORDER — FENTANYL CITRATE 0.05 MG/ML IJ SOLN
INTRAMUSCULAR | Status: DC | PRN
  Administered 2012-06-14 (×5): 50 ug via INTRAVENOUS

## 2012-06-14 MED ORDER — DULOXETINE HCL 60 MG PO CPEP
60.0000 mg | ORAL_CAPSULE | Freq: Every day | ORAL | Status: DC
  Filled 2012-06-14: qty 1

## 2012-06-14 MED ORDER — LACTATED RINGERS IV SOLN
INTRAVENOUS | Status: DC | PRN
  Administered 2012-06-14: 09:00:00 via INTRAVENOUS

## 2012-06-14 MED ORDER — 0.9 % SODIUM CHLORIDE (POUR BTL) OPTIME
TOPICAL | Status: DC | PRN
  Administered 2012-06-14: 1000 mL

## 2012-06-14 MED ORDER — SUCCINYLCHOLINE CHLORIDE 20 MG/ML IJ SOLN
INTRAMUSCULAR | Status: DC | PRN
  Administered 2012-06-14: 100 mg via INTRAVENOUS

## 2012-06-14 MED ORDER — ONDANSETRON HCL 8 MG PO TABS
4.0000 mg | ORAL_TABLET | Freq: Four times a day (QID) | ORAL | Status: DC | PRN
  Filled 2012-06-14: qty 0.5

## 2012-06-14 MED ORDER — SODIUM CHLORIDE 0.9 % IV SOLN: INTRAVENOUS | Status: DC

## 2012-06-14 MED ORDER — FENTANYL CITRATE 0.05 MG/ML IJ SOLN
25.0000 ug | INTRAMUSCULAR | Status: DC | PRN
  Administered 2012-06-14 – 2012-06-15 (×4): 25 ug via INTRAVENOUS
  Filled 2012-06-14 (×4): qty 2

## 2012-06-14 MED ORDER — LIDOCAINE HCL 4 % MT SOLN
OROMUCOSAL | Status: DC | PRN
  Administered 2012-06-14: 4 mL via TOPICAL

## 2012-06-14 MED ORDER — MIDAZOLAM HCL 5 MG/5ML IJ SOLN
INTRAMUSCULAR | Status: DC | PRN
  Administered 2012-06-14: 2 mg via INTRAVENOUS

## 2012-06-14 MED ORDER — PROMETHAZINE HCL 25 MG/ML IJ SOLN: 12.5000 mg | Freq: Four times a day (QID) | INTRAMUSCULAR | Status: DC | PRN

## 2012-06-14 MED ORDER — IBUPROFEN 100 MG/5ML PO SUSP
400.0000 mg | Freq: Four times a day (QID) | ORAL | Status: DC | PRN
  Administered 2012-06-14: 400 mg via ORAL
  Filled 2012-06-14 (×2): qty 20

## 2012-06-14 MED ORDER — CLINDAMYCIN PHOSPHATE 600 MG/50ML IV SOLN
600.0000 mg | Freq: Once | INTRAVENOUS | Status: AC
  Administered 2012-06-14: 600 mg via INTRAVENOUS
  Filled 2012-06-14: qty 50

## 2012-06-14 MED ORDER — ONDANSETRON HCL 4 MG/2ML IJ SOLN
INTRAMUSCULAR | Status: DC | PRN
  Administered 2012-06-14: 4 mg via INTRAVENOUS

## 2012-06-14 MED ORDER — HYDROMORPHONE HCL 2 MG PO TABS
1.0000 mg | ORAL_TABLET | ORAL | Status: DC | PRN
  Administered 2012-06-14 – 2012-06-15 (×4): 2 mg via ORAL
  Filled 2012-06-14 (×3): qty 1
  Filled 2012-06-14: qty 2

## 2012-06-14 MED ORDER — PROPOFOL 10 MG/ML IV EMUL
INTRAVENOUS | Status: DC | PRN
  Administered 2012-06-14: 120 mg via INTRAVENOUS

## 2012-06-14 MED ORDER — KCL IN DEXTROSE-NACL 10-5-0.45 MEQ/L-%-% IV SOLN
INTRAVENOUS | Status: DC
  Administered 2012-06-14 (×2): via INTRAVENOUS
  Filled 2012-06-14 (×7): qty 1000

## 2012-06-14 MED ORDER — LIDOCAINE VISCOUS 2 % MT SOLN
20.0000 mL | Freq: Once | OROMUCOSAL | Status: DC
  Filled 2012-06-14: qty 15

## 2012-06-14 MED ORDER — MEPERIDINE HCL 25 MG/ML IJ SOLN: 6.2500 mg | INTRAMUSCULAR | Status: DC | PRN

## 2012-06-14 MED ORDER — SODIUM CHLORIDE 0.9 % IV SOLN
3.0000 g | Freq: Four times a day (QID) | INTRAVENOUS | Status: DC
  Administered 2012-06-14 – 2012-06-15 (×4): 3 g via INTRAVENOUS
  Filled 2012-06-14 (×6): qty 3

## 2012-06-14 MED ORDER — LIDOCAINE HCL (CARDIAC) 20 MG/ML IV SOLN
INTRAVENOUS | Status: DC | PRN
  Administered 2012-06-14: 100 mg via INTRAVENOUS

## 2012-06-14 MED ORDER — DEXAMETHASONE SODIUM PHOSPHATE 10 MG/ML IJ SOLN
10.0000 mg | Freq: Three times a day (TID) | INTRAMUSCULAR | Status: AC
  Administered 2012-06-14 – 2012-06-15 (×3): 10 mg via INTRAVENOUS
  Filled 2012-06-14 (×3): qty 1

## 2012-06-14 MED ORDER — TRAZODONE HCL 100 MG PO TABS
100.0000 mg | ORAL_TABLET | Freq: Every day | ORAL | Status: DC
  Administered 2012-06-14: 100 mg via ORAL
  Filled 2012-06-14 (×2): qty 1

## 2012-06-14 MED ORDER — HYDROMORPHONE HCL PF 1 MG/ML IJ SOLN
0.2500 mg | INTRAMUSCULAR | Status: DC | PRN
  Administered 2012-06-14 (×4): 0.5 mg via INTRAVENOUS

## 2012-06-14 MED ORDER — ACETAMINOPHEN 325 MG PO TABS
650.0000 mg | ORAL_TABLET | Freq: Once | ORAL | Status: AC
  Administered 2012-06-14: 650 mg via ORAL
  Filled 2012-06-14: qty 2

## 2012-06-14 MED ORDER — PROMETHAZINE HCL 25 MG/ML IJ SOLN: 6.2500 mg | INTRAMUSCULAR | Status: DC | PRN

## 2012-06-14 MED ORDER — ONDANSETRON HCL 4 MG/2ML IJ SOLN: 4.0000 mg | Freq: Four times a day (QID) | INTRAMUSCULAR | Status: DC | PRN

## 2012-06-14 MED ORDER — SODIUM CHLORIDE 0.9 % IV SOLN
INTRAVENOUS | Status: DC | PRN
  Administered 2012-06-14: 07:00:00 via INTRAVENOUS

## 2012-06-14 MED ORDER — IOHEXOL 300 MG/ML  SOLN
100.0000 mL | Freq: Once | INTRAMUSCULAR | Status: AC | PRN
  Administered 2012-06-14: 100 mL via INTRAVENOUS

## 2012-06-14 SURGICAL SUPPLY — 32 items
CANISTER SUCTION 2500CC (MISCELLANEOUS) ×2 IMPLANT
CATH ROBINSON RED A/P 10FR (CATHETERS) ×1 IMPLANT
CLEANER TIP ELECTROSURG 2X2 (MISCELLANEOUS) ×2 IMPLANT
CLOTH BEACON ORANGE TIMEOUT ST (SAFETY) ×2 IMPLANT
COAGULATOR SUCT SWTCH 10FR 6 (ELECTROSURGICAL) ×2 IMPLANT
COVER SURGICAL LIGHT HANDLE (MISCELLANEOUS) ×1 IMPLANT
ELECT COATED BLADE 2.86 ST (ELECTRODE) ×2 IMPLANT
ELECT REM PT RETURN 9FT ADLT (ELECTROSURGICAL) ×2
ELECT REM PT RETURN 9FT PED (ELECTROSURGICAL)
ELECTRODE REM PT RETRN 9FT PED (ELECTROSURGICAL) IMPLANT
ELECTRODE REM PT RTRN 9FT ADLT (ELECTROSURGICAL) IMPLANT
GAUZE SPONGE 4X4 16PLY XRAY LF (GAUZE/BANDAGES/DRESSINGS) ×2 IMPLANT
GLOVE BIOGEL PI IND STRL 7.5 (GLOVE) IMPLANT
GLOVE BIOGEL PI INDICATOR 7.5 (GLOVE) ×1
GLOVE SURG SS PI 7.5 STRL IVOR (GLOVE) ×3 IMPLANT
GOWN STRL NON-REIN LRG LVL3 (GOWN DISPOSABLE) ×3 IMPLANT
KIT BASIN OR (CUSTOM PROCEDURE TRAY) ×2 IMPLANT
KIT ROOM TURNOVER OR (KITS) ×2 IMPLANT
NS IRRIG 1000ML POUR BTL (IV SOLUTION) ×2 IMPLANT
PACK SURGICAL SETUP 50X90 (CUSTOM PROCEDURE TRAY) ×2 IMPLANT
PAD ARMBOARD 7.5X6 YLW CONV (MISCELLANEOUS) ×4 IMPLANT
PENCIL BUTTON HOLSTER BLD 10FT (ELECTRODE) ×2 IMPLANT
SPECIMEN JAR SMALL (MISCELLANEOUS) ×2 IMPLANT
SPONGE TONSIL 1 RF SGL (DISPOSABLE) ×1 IMPLANT
SWAB COLLECTION DEVICE MRSA (MISCELLANEOUS) ×1 IMPLANT
SYR BULB 3OZ (MISCELLANEOUS) ×2 IMPLANT
TOWEL OR 17X24 6PK STRL BLUE (TOWEL DISPOSABLE) ×4 IMPLANT
TUBE ANAEROBIC SPECIMEN COL (MISCELLANEOUS) ×1 IMPLANT
TUBE CONNECTING 12X1/4 (SUCTIONS) ×2 IMPLANT
TUBE SALEM SUMP 12R W/ARV (TUBING) IMPLANT
WATER STERILE IRR 1000ML POUR (IV SOLUTION) ×1 IMPLANT
YANKAUER SUCT BULB TIP NO VENT (SUCTIONS) ×2 IMPLANT

## 2012-06-14 NOTE — Progress Notes (Signed)
06/14/2012 7:23 PM  Maria Dougherty [id number]  Post-Op Check    Temp:  [97.5 F (36.4 C)-100.2 F (37.9 C)] 98.4 F (36.9 C) (07/29 1803) Pulse Rate:  [65-90] 70  (07/29 1803) Resp:  [14-30] 20  (07/29 1803) BP: (97-193)/(38-88) 144/73 mmHg (07/29 1803) SpO2:  [95 %-100 %] 99 % (07/29 1803),     Intake/Output Summary (Last 24 hours) at 06/14/12 1923 Last data filed at 06/14/12 1400  Gross per 24 hour  Intake   1505 ml  Output     32 ml  Net   1473 ml    Results for orders placed during the hospital encounter of 06/14/12 (from the past 24 hour(s))  RAPID STREP SCREEN     Status: Normal   Collection Time   06/13/12  8:38 PM      Component Value Range   Streptococcus, Group A Screen (Direct) NEGATIVE  NEGATIVE  POCT I-STAT, CHEM 8     Status: Abnormal   Collection Time   06/14/12  4:02 AM      Component Value Range   Sodium 142  135 - 145 mEq/L   Potassium 3.4 (*) 3.5 - 5.1 mEq/L   Chloride 105  96 - 112 mEq/L   BUN 4 (*) 6 - 23 mg/dL   Creatinine, Ser 4.09  0.50 - 1.10 mg/dL   Glucose, Bld 811 (*) 70 - 99 mg/dL   Calcium, Ion 9.14  7.82 - 1.23 mmol/L   TCO2 26  0 - 100 mmol/L   Hemoglobin 14.6  12.0 - 15.0 g/dL   HCT 95.6  21.3 - 08.6 %    SUBJECTIVE:  Pain controlled on Fentanyl IV.  + void.  No bleeding.  No SOB.    OBJECTIVE:  Color, energy very good.  Voice strong and clear.  Fossae clean and dry.  IMPRESSION:  Satisfactory Check s/p tonsillectomy "au chaux" (hot).  PLAN:  Itching as a side effect of several narcotics, but doubt true allergy.  I/O's.  Will try po Dilaudid.  Anticipate D/C in AM.  Flo Shanks

## 2012-06-14 NOTE — ED Provider Notes (Signed)
MSE was initiated and I personally evaluated the patient and placed orders (if any) at  1:27 AM on June 14, 2012.  The remainder of the MSE may be completed by another ED provider.  Pt with neck pain, sore throat, no visible swelling noted, voice normal and no stridor Pt has already seen PCP for this.  Neck soft tissue ordered for screening  Joya Gaskins, MD 06/14/12 0128

## 2012-06-14 NOTE — ED Provider Notes (Signed)
History     CSN: [id number]  Arrival date & time 06/13/12  1952   First MD Initiated Contact with Patient 06/14/12 0115      Chief Complaint  Patient presents with  . Sore Throat  . Dizziness  . Nausea     HPI Pt was seen at 0310.  Per pt, c/o gradual onset and worsening of persistent sore throat for the past 5 days.  States she was eval by her PMD for same and dx "virus."  States the sore throat became worse today; describes the pain as "it feels like something is in my throat."  Having home temps to "100."  Denies SOB/cough, no rash, no dysphagia.    Past Medical History  Diagnosis Date  . Hypertension   . Hyperlipidemia   . Depression   . Sleep difficulties     Past Surgical History  Procedure Date  . Cervical disc surgery   . Back surgery   . Elbow surgery     rt elbow  . Abdominal hysterectomy   . Bowel obstruction   . Hernia repair 06/20/2011    Lap VWH repair Carman Ching, MD)    Family History  Problem Relation Age of Onset  . Heart disease Mother   . Cancer Mother   . Heart disease Father   . Mental illness Brother     History  Substance Use Topics  . Smoking status: Current Everyday Smoker -- 0.2 packs/day for 10 years    Types: Cigarettes  . Smokeless tobacco: Never Used  . Alcohol Use: 0.0 oz/week    2-3 Glasses of wine per week    Review of Systems ROS: Statement: All systems negative except as marked or noted in the HPI; Constitutional: Negative for fever and chills. ; ; Eyes: Negative for eye pain, redness and discharge. ; ; ENMT: Negative for ear pain, hoarseness, nasal congestion, sinus pressure and +sore throat. ; ; Cardiovascular: Negative for chest pain, palpitations, diaphoresis, dyspnea and peripheral edema. ; ; Respiratory: Negative for cough, wheezing and stridor. ; ; Gastrointestinal: Negative for nausea, vomiting, diarrhea, abdominal pain, blood in stool, hematemesis, jaundice and rectal bleeding. . ; ; Genitourinary: Negative for  dysuria, flank pain and hematuria. ; ; Musculoskeletal: Negative for back pain and neck pain. Negative for swelling and trauma.; ; Skin: Negative for pruritus, rash, abrasions, blisters, bruising and skin lesion.; ; Neuro: Negative for headache, lightheadedness and neck stiffness. Negative for weakness, altered level of consciousness , altered mental status, extremity weakness, paresthesias, involuntary movement, seizure and syncope.     Allergies  Codeine; Percocet; and Hydrocodone  Home Medications   Current Outpatient Rx  Name Route Sig Dispense Refill  . AMOXICILLIN 500 MG PO CAPS Oral Take 500 mg by mouth every 8 (eight) hours.    . DULOXETINE HCL 60 MG PO CPEP Oral Take 60 mg by mouth daily.      Marland Kitchen NAPROXEN SODIUM 220 MG PO TABS Oral Take 440 mg by mouth every 3 (three) hours as needed. For pain    . TRAZODONE HCL 100 MG PO TABS Oral Take 100 mg by mouth at bedtime.        BP 145/59  Pulse 65  Temp 98.7 F (37.1 C) (Oral)  Resp 18  SpO2 98%  Physical Exam 0315: Physical examination:  Nursing notes reviewed; Vital signs and O2 SAT reviewed;  Constitutional: Well developed, Well nourished, Well hydrated, In no acute distress; Head:  Normocephalic, atraumatic; Eyes: EOMI, PERRL, No  scleral icterus; ENMT: +post pharynx erythemetous with right tonsillar and soft palate bulging.  No hoarse voice, no drooling, no stridor. Mouth normal, Mucous membranes moist; Neck: Supple, Full range of motion, No lymphadenopathy; Cardiovascular: Regular rate and rhythm, No gallop; Respiratory: Breath sounds clear & equal bilaterally, No wheezes.  Speaking full sentences with ease, Normal respiratory effort/excursion; Chest: Nontender, Movement normal;; Extremities: Pulses normal, No tenderness, No edema, No calf edema or asymmetry.; Neuro: AA&Ox3, Major CN grossly intact.  Speech clear. No gross focal motor or sensory deficits in extremities.; Skin: Color normal, Warm, Dry.   ED Course  Procedures     MDM  MDM Reviewed: nursing note and vitals Interpretation: labs, x-ray and CT scan     Results for orders placed during the hospital encounter of 06/14/12  RAPID STREP SCREEN      Component Value Range   Streptococcus, Group A Screen (Direct) NEGATIVE  NEGATIVE  POCT I-STAT, CHEM 8      Component Value Range   Sodium 142  135 - 145 mEq/L   Potassium 3.4 (*) 3.5 - 5.1 mEq/L   Chloride 105  96 - 112 mEq/L   BUN 4 (*) 6 - 23 mg/dL   Creatinine, Ser 1.61  0.50 - 1.10 mg/dL   Glucose, Bld 096 (*) 70 - 99 mg/dL   Calcium, Ion 0.45  4.09 - 1.23 mmol/L   TCO2 26  0 - 100 mmol/L   Hemoglobin 14.6  12.0 - 15.0 g/dL   HCT 81.1  91.4 - 78.2 %   Dg Neck Soft Tissue 06/14/2012  *RADIOLOGY REPORT*  Clinical Data: Sore throat.  Dizziness, nausea.  NECK SOFT TISSUES - 1+ VIEW  Comparison: Cervical spine series on 09/17/2007  Findings: The prevertebral soft tissue line anterior to C1-C3 levels appears thickened, raising question of edema or retropharyngeal abscess.  Cervical alignment is normal.  IMPRESSION: Prevertebral soft tissue thickening.  Consider further evaluation with CT of the neck with contrast.  Original Report Authenticated By: Patterson Hammersmith, M.D.   Ct Soft Tissue Neck W Contrast 06/14/2012  *RADIOLOGY REPORT*  Clinical Data: Sore throat, neck pain.  CT NECK WITH CONTRAST  Technique:  Multidetector CT imaging of the neck was performed with intravenous contrast.  Contrast: OMNIPAQUE IOHEXOL 300 MG/ML  SOLN  Comparison: 06/14/2012 radiograph  Findings: Right peritonsillar area somewhat ill-defined peripherally enhancing collection, measuring approximately 1.7 x 1.3 cm, most in keeping with a peritonsillar abscess.  There is a 4 mm rounded calcific density inseparable from the collection/tonsil seen anteriorly, adjacent to the base of the tongue. Unremarkable nasopharynx and nasal cavity.  Normal epiglottis, larynx, and thyroid gland.  Lung apices are predominately clear with mild  biapical scarring.  No acute osseous finding.  Loss of cervical lordosis is likely positional.  Visualized intracranial contents are within normal limits.  Symmetric parotid glands and submandibular glands. Prominent right cervical chain lymph nodes may be reactive.  Normal caliber vasculature.  IMPRESSION: Peritonsillar rim enhancing collection is favored to represent an abscess. Given the patient's age, direct inspection to exclude a mass is recommended.  Prominent ipsilateral cervical chain lymph nodes are favored to be reactive.  There is an oval calcific density associated with the right tonsil. Favored to represent a tonsillolith.  Foreign body not entirely excluded.  Original Report Authenticated By: Waneta Martins, M.D.     [id number]:  +peritonisillar abscess right.  Will start IV clindamycin.  Dx testing d/w pt and family.  Questions answered.  Verb understanding, agreeable to eval by ENT MD in the ED.  T/C to ENT Dr. Emeline Darling, case discussed, including:  HPI, pertinent PM/SHx, VS/PE, dx testing, ED course and treatment:  Agreeable to eval in the ED.    0630:  ENT has eval pt in the ED, will be taking her to the OR for I&D.     Laray Anger, DO 06/15/12 1104

## 2012-06-14 NOTE — H&P (Signed)
06/14/12 6:17 AM  Maria Dougherty  PREOPERATIVE HISTORY AND PHYSICAL  CHIEF COMPLAINT: right peritonsillar abscess  HISTORY: This is a 56 year old who presents with sore throat and right foreign body sensation for several days, has not been able to eat or drink recently.  She now presents with a right peritonsillar abscess on neck CT with contrast. Attempted incision and drainage in the ER was unsuccessful so she will be taken to the OR for tonsillectomy and I&D of peritonsillar abscess.  Dr. Emeline Darling, [place] has discussed the risks, benefits, and alternatives of this procedure. The patient understands the risks and would like to proceed with the procedure. The chances of success of the procedure are >50% and the patient understands this. I personally performed an examination of the patient within 24 hours of the procedure.  PAST MEDICAL HISTORY: Past Medical History  Diagnosis Date  . Hypertension   . Hyperlipidemia   . Depression   . Sleep difficulties     PAST SURGICAL HISTORY: Past Surgical History  Procedure Date  . Cervical disc surgery   . Back surgery   . Elbow surgery     rt elbow  . Abdominal hysterectomy   . Bowel obstruction   . Hernia repair 06/20/2011    Lap VWH repair Carman Ching, MD)    MEDICATIONS: No current facility-administered medications on file prior to encounter.   Current Outpatient Prescriptions on File Prior to Encounter  Medication Sig Dispense Refill  . DULoxetine (CYMBALTA) 60 MG capsule Take 60 mg by mouth daily.        . traZODone (DESYREL) 100 MG tablet Take 100 mg by mouth at bedtime.          ALLERGIES: Allergies  Allergen Reactions  . Codeine Itching    All over the body.  . Percocet (Oxycodone-Acetaminophen) Itching    All over body. "All -cets"  . Hydrocodone Itching      SOCIAL HISTORY: History   Social History  . Marital Status: Divorced    Spouse Name: N/A    Number of Children: N/A  . Years of Education: N/A    Occupational History  . Not on file.   Social History Main Topics  . Smoking status: Current Everyday Smoker -- 0.2 packs/day for 10 years    Types: Cigarettes  . Smokeless tobacco: Never Used  . Alcohol Use: 0.0 oz/week    2-3 Glasses of wine per week  . Drug Use: No  . Sexually Active: Not on file   Other Topics Concern  . Not on file   Social History Narrative  . No narrative on file    FAMILY HISTORY: Family History  Problem Relation Age of Onset  . Heart disease Mother   . Cancer Mother   . Heart disease Father   . Mental illness Brother     REVIEW OF SYSTEMS:  Right throat pain and swelling, otherwise negative x 10 systems except per HPI   PHYSICAL EXAM:  GENERAL:  NAD VITAL SIGNS:   Filed Vitals:   06/14/12 0328  BP: 145/59  Pulse: 65  Temp:   Resp: 18    SKIN:  Warm, dry HEENT:  Mallampati class I. She has Friedman 3+ tonsils with a right anterior tonsillar pillar showing obvious edema and induration with an exudate on the right tonsil.  NECK:  supple LYMPH:  Some scattered small lymph nodes BL LUNGS:  Grossly clear CARDIOVASCULAR:  RRR ABDOMEN:  Soft, nontender MUSCULOSKELETAL: normal strength PSYCH:  Alert and  oriented NEUROLOGIC:  CN 2-12 intact and symmetric  DIAGNOSTIC STUDIES: neck CT with contrast shows 1.5-2cm right peritonsillar abscess.  ASSESSMENT AND PLAN: Plan to proceed with bilateral tonsillectomy and drainage of peritonsillar abscess. Patient understands the risks, benefits, and alternatives. Informed written consent is on chart. 06/14/12 6:17 AM Maria Dougherty

## 2012-06-14 NOTE — Anesthesia Preprocedure Evaluation (Addendum)
Anesthesia Evaluation  Patient identified by MRN, date of birth, ID band Patient awake    Reviewed: Allergy & Precautions, H&P , NPO status , Patient's Chart, lab work & pertinent test results, reviewed documented beta blocker date and time   History of Anesthesia Complications Negative for: history of anesthetic complications  Airway Mallampati: II TM Distance: >3 FB Neck ROM: Full    Dental  (+) Teeth Intact and Dental Advisory Given   Pulmonary Current Smoker,          Cardiovascular hypertension,     Neuro/Psych Depression negative neurological ROS     GI/Hepatic negative GI ROS, Neg liver ROS,   Endo/Other  negative endocrine ROSMorbid obesity  Renal/GU negative Renal ROS  negative genitourinary   Musculoskeletal negative musculoskeletal ROS (+)   Abdominal   Peds  Hematology negative hematology ROS (+)   Anesthesia Other Findings   Reproductive/Obstetrics negative OB ROS                          Anesthesia Physical Anesthesia Plan  ASA: II  Anesthesia Plan: General   Post-op Pain Management:    Induction: Intravenous  Airway Management Planned: Oral ETT  Additional Equipment:   Intra-op Plan:   Post-operative Plan: Extubation in OR  Informed Consent:   Dental advisory given  Plan Discussed with: CRNA and Surgeon  Anesthesia Plan Comments:         Anesthesia Quick Evaluation

## 2012-06-14 NOTE — Op Note (Signed)
DATE OF OPERATION: 06/14/12 8:52 AM Surgeon: Melvenia Beam Procedure Performed: CPT code 16109 bilateral tonsillectomy greater than 54 years old  PREOPERATIVE DIAGNOSIS: tonsillar hypertrophy, acute and chronic tonsillitis with right peritonsillar abscess  POSTOPERATIVE DIAGNOSIS: tonsillar hypertrophy, acute and chronic tonsillitis with right peritonsillar abscess SURGEON: Melvenia Beam ANESTHESIA: General endotracheal.  ESTIMATED BLOOD LOSS: Approximately 5 mL.  DRAINS: none SPECIMENS: tonsils and right peritonsillar abscess culture INDICATIONS: The patient is a 55yo with a history of sore throat and poor PO intake for several days with chronic tonsillitis and CT showing right peritonsillar abscess. DESCRIPTION OF OPERATION: The patient was brought to the operating room and was placed in the supine position and was placed under general endotracheal anesthesia by anesthesiology. The bed was turned 90 degrees and the Crowe-Davis mouth retractor was placed over the endotracheal tube and suspended from the Mayo stand. The palate was inspected and palpated and noted to be intact with no submucous cleft. The uvula was edematous and deviated to the left. The pharynx and tonsillar pillars also showed bilateral edema.   The right tonsil was bulging from the right tonsillar fossa. The right tonsil was grasped with a curved Allis clamp and dissected from the right tonsillar fossa using the Bovie. Meticulous hemostasis was then achieved. At the right inferior and posterior aspect of the right peritonsillar space an abscess cavity was encountered and opened/drained. 5mL of grossly purulent fluid was cultured and then drained and irrigated out and suctioned. The left tonsil was then grasped with the curved Allis and dissected from the left tonsillar fossa using the Bovie. Meticulous hemostasis was achieved.  The  oropharynx was irrigated out and then the the nose, oral cavity, and stomach were suctioned out.  The Crowe-Davis retractor and red dubber catheter were then removed. The patient was turned back to anesthesia and awakened from anesthesia and extubated without difficulty. The patient tolerated the procedure well with no immediate complications and was taken to the postoperative recovery area in good condition.   Dr. Melvenia Beam was present and performed the entire procedure. 06/14/12 8:51 AM Melvenia Beam

## 2012-06-14 NOTE — Preoperative (Signed)
Beta Blockers   Reason not to administer Beta Blockers:Not Applicable 

## 2012-06-14 NOTE — Anesthesia Postprocedure Evaluation (Signed)
  Anesthesia Post-op Note  Patient: Maria Dougherty  Procedure(s) Performed: Procedure(s) (LRB): TONSILLECTOMY (Bilateral)  Patient Location: PACU  Anesthesia Type: General  Level of Consciousness: awake  Airway and Oxygen Therapy: Patient Spontanous Breathing  Post-op Pain: mild  Post-op Assessment: Post-op Vital signs reviewed  Post-op Vital Signs: stable  Complications: No apparent anesthesia complications

## 2012-06-14 NOTE — Transfer of Care (Signed)
Immediate Anesthesia Transfer of Care Note  Patient: Maria Dougherty  Procedure(s) Performed: Procedure(s) (LRB): TONSILLECTOMY (Bilateral)  Patient Location: PACU  Anesthesia Type: General  Level of Consciousness: awake, alert  and oriented  Airway & Oxygen Therapy: Patient Spontanous Breathing and Patient connected to nasal cannula oxygen  Post-op Assessment: Report given to PACU RN and Post -op Vital signs reviewed and stable  Post vital signs: Reviewed and stable  Complications: No apparent anesthesia complications

## 2012-06-15 ENCOUNTER — Encounter (HOSPITAL_COMMUNITY): Payer: Self-pay | Admitting: Otolaryngology

## 2012-06-15 MED FILL — Hydromorphone HCl Inj 1 MG/ML: INTRAMUSCULAR | Qty: 1 | Status: AC

## 2012-06-15 NOTE — Discharge Summary (Signed)
06/15/12 6:59 AM  Date of Admission:06/14/12 Date of Discharge:06/15/12  Discharge EA:VWUJ, [place]  Admitting WJ:XBJY, [place] Riley  Reason for admission/final discharge diagnosis: right peritonsillar abscess, chronic tonsillitis  Labs: gram stain shows few gram positive rods, culture/sensitivities pending at discharge  Procedure(s) performed:42826-bilateral tonsillectomy >age 54 06/14/12  Discharge Condition:improved  Discharge Exam:normal voice, pain well controlled, oral cavity with tonsils surgically absent and hemostatic tonsillar fossae. Uvula midline. Neck supple, trachea midline, CN 2-12 intact and symmetric.  Discharge Instructions: No heavy lifting, follow up with  Dr. At [facility] in weeks  Hospital Course: Admitted after several days of sore throat and ER CT scan showing R peritonsillar abscess. Underwent tonsillectomy and drainage of the abscess on 06/14/12. Did well post-op with pain well controlled and no bleeding. Discharged home on POD#1 with amoxicillin, Zofran, tylenol/ibuprofen, PO Dilaudid as needed, and prednisolone taper.  Melvenia Beam 6:59 AM 06/15/12

## 2012-06-17 LAB — CULTURE, ROUTINE-ABSCESS

## 2012-06-19 LAB — ANAEROBIC CULTURE

## 2012-12-06 ENCOUNTER — Encounter: Payer: Self-pay | Admitting: Gastroenterology

## 2012-12-06 ENCOUNTER — Encounter: Payer: Self-pay | Admitting: Family Medicine

## 2012-12-06 ENCOUNTER — Ambulatory Visit (INDEPENDENT_AMBULATORY_CARE_PROVIDER_SITE_OTHER): Payer: 59 | Admitting: Family Medicine

## 2012-12-06 VITALS — BP 112/70 | HR 80 | Wt 142.0 lb

## 2012-12-06 DIAGNOSIS — R194 Change in bowel habit: Secondary | ICD-10-CM

## 2012-12-06 DIAGNOSIS — K439 Ventral hernia without obstruction or gangrene: Secondary | ICD-10-CM

## 2012-12-06 DIAGNOSIS — R198 Other specified symptoms and signs involving the digestive system and abdomen: Secondary | ICD-10-CM

## 2012-12-06 DIAGNOSIS — Z8719 Personal history of other diseases of the digestive system: Secondary | ICD-10-CM

## 2012-12-06 NOTE — Progress Notes (Signed)
  Subjective:    Patient ID: Maria Dougherty, female    DOB: 09-12-1956, 57 y.o.   MRN: [id number]  HPI She is here for consult concerning continued difficulty with abdominal pain. She has a previous history of incisional hernia with repair. She states she has noted increasing abdominal pain especially when she lies down. She is also noted a decrease in the character of her stool. She has a previous history of small bowel obstruction and subsequent surgery. She has a remote history of cervical cancer and hysterectomy. She has had previous colonoscopy in 2007.   Review of Systems     Objective:   Physical Exam Alert and in no distress. Abdominal exam shows active bowel sounds. Ventral hernia is noted in and seems to be relatively large.       Assessment & Plan:   1. Ventral hernia  Ambulatory referral to Gastroenterology  2. Change in stool habits  Ambulatory referral to Gastroenterology  3. History of small bowel obstruction  Ambulatory referral to Gastroenterology   her symptoms are suggestive of possible obstruction and therefore referral to GI will be made.

## 2012-12-15 ENCOUNTER — Ambulatory Visit (INDEPENDENT_AMBULATORY_CARE_PROVIDER_SITE_OTHER): Payer: 59 | Admitting: Surgery

## 2012-12-15 ENCOUNTER — Encounter (INDEPENDENT_AMBULATORY_CARE_PROVIDER_SITE_OTHER): Payer: Self-pay | Admitting: Surgery

## 2012-12-15 VITALS — BP 114/80 | HR 68 | Temp 97.9°F | Resp 14 | Ht 63.5 in | Wt 142.4 lb

## 2012-12-15 DIAGNOSIS — K432 Incisional hernia without obstruction or gangrene: Secondary | ICD-10-CM

## 2012-12-15 NOTE — Progress Notes (Signed)
Subjective:     Patient ID: ANNLEE GLANDON, female   DOB: [birth year], 57 y.o.   MRN: [id number]  HPI This is a very pleasant 57 year old female that I performed a laparoscopic incisional hernia repair with mesh in 2012. She has noticed a hernia in her lower midline above the pubis more than a year. She recently has been having occasional crampy abdominal pain as well as moderate to severe constipation. She has had no nausea or vomiting. She denies blood in her bowel movements. She is being scheduled to see Dr. Arlyce Dice in 2 weeks. Her last colonoscopy was in 2007.  Review of Systems     Objective:   Physical Exam On exam, there is a reducible, nontender incisional hernia at the lower midline above the pubis. This is inferior to her previous hernia repair with mesh    Assessment:     Incisional hernia    Plan:     Repair with mesh was recommended for the hernia. I am uncertain if all her symptoms are being caused by the hernia. The severe constipation is concerning. She will be seeing Dr. Arlyce Dice and I will defer to him whether she should get a preoperative colonoscopy prior to hernia repair. She will notify me after seeing Dr. Arlyce Dice and we will proceed from there

## 2012-12-21 ENCOUNTER — Telehealth: Payer: Self-pay | Admitting: Gastroenterology

## 2012-12-21 ENCOUNTER — Ambulatory Visit (INDEPENDENT_AMBULATORY_CARE_PROVIDER_SITE_OTHER): Payer: 59 | Admitting: Family Medicine

## 2012-12-21 ENCOUNTER — Encounter: Payer: Self-pay | Admitting: Family Medicine

## 2012-12-21 ENCOUNTER — Telehealth: Payer: Self-pay | Admitting: Family Medicine

## 2012-12-21 VITALS — BP 130/90 | HR 78 | Wt 141.0 lb

## 2012-12-21 DIAGNOSIS — R109 Unspecified abdominal pain: Secondary | ICD-10-CM

## 2012-12-21 NOTE — Telephone Encounter (Signed)
Pt called, she can not get into see Dr. Arlyce Dice until 12/30/12, can we get her in any earlier.  Her sx are getting worse.  Wakes up during night with loose stool and stomach cramping.  Pain is now from 5 - 8.  She saw the surgeon regarding the hernia and he states mesh from prior surgery is now presenting lower, dr Janae Bridgeman cannot do this laproscopic.  However, he wants to wait until after colonoscopy before doing anything with hernia.  Pt is looking atleast several more weeks before colonoscopy at this rate.  She has no appetite and losing weight and pain increased.  Please advise if you can help and get her in sooner

## 2012-12-21 NOTE — Progress Notes (Signed)
  Subjective:    Patient ID: Maria Dougherty, female    DOB: 1956-01-02, 57 y.o.   MRN: [id number]  HPI In the last several days she has noted increased abdominal cramping with diarrhea that started last night and continues today. She has had at least 4 loose stools today. Scheduled to see Dr. Arlyce Dice February 13. She has a previous history of abdominal adhesions with surgical intervention. She does note  a decrease in weight as well as stool size.   Review of Systems     Objective:   Physical Exam Alert and in no distress. Abdominal exam shows active, loud bowel sounds but no tenderness.       Assessment & Plan:   1. Abdominal cramping  CT Abdomen Pelvis W Contrast   this certainly could be an obstruction. Case was discussed with radiology. She will be sent for CT of abdomen and pelvis.

## 2012-12-21 NOTE — Telephone Encounter (Signed)
Spoke with Cordelia Pen and let her know Dr. Arlyce Dice cannot see the pt any sooner. If pt needs to be seen sooner than scheduled appt let them know she could be seen by a midlevel. Office will call back if they want her to be seen by midlevel.

## 2012-12-22 ENCOUNTER — Ambulatory Visit
Admission: RE | Admit: 2012-12-22 | Discharge: 2012-12-22 | Disposition: A | Discharge status: Home / Self Care | Payer: 59 | Source: Ambulatory Visit | Attending: Family Medicine | Admitting: Family Medicine

## 2012-12-22 ENCOUNTER — Telehealth: Payer: Self-pay | Admitting: Family Medicine

## 2012-12-22 DIAGNOSIS — R109 Unspecified abdominal pain: Secondary | ICD-10-CM

## 2012-12-22 MED ORDER — IOHEXOL 300 MG/ML  SOLN
100.0000 mL | Freq: Once | INTRAMUSCULAR | Status: AC | PRN
  Administered 2012-12-22: 100 mL via INTRAVENOUS

## 2012-12-22 NOTE — Progress Notes (Signed)
Quick Note:  Let her know that the CT scan showed no acute findings. Explain to her that I cannot give her a note taking her out of work since I have no medical reason to do that. ______

## 2012-12-23 NOTE — Progress Notes (Signed)
Quick Note:  CALLED PT AT WORK BECAUSE HOME AND CELL WAS FULL AND NOT TAKING CALLS AND WORK SAID SHE WOULD NOT BE THERE THE REST OF THE WEEK ______

## 2012-12-28 NOTE — Telephone Encounter (Signed)
TSD  

## 2012-12-29 ENCOUNTER — Telehealth: Payer: Self-pay | Admitting: Internal Medicine

## 2012-12-29 ENCOUNTER — Telehealth: Payer: Self-pay | Admitting: Gastroenterology

## 2012-12-29 NOTE — Telephone Encounter (Signed)
Offered pt an appt today with the PA. Pt wants to see Dr. Arlyce Dice. Pt scheduled to see Dr. Arlyce Dice 01/03/13@10 :30am. Pt aware of appt date and time.

## 2012-12-29 NOTE — Telephone Encounter (Signed)
See if you can help get her an appointment sooner than March 10 . It is ridiculous to wait that long. Explained to the patient that it is not related to her hernia.

## 2012-12-29 NOTE — Telephone Encounter (Signed)
Pt called stating that she is suppose to see Dr. Arlyce Dice tomorrow but they have scheduled her appt due to snow and they can't see her again til march 10 and she says she can't wait until then. She is having lots of pain,bloating,loss of appetite, loose stool and not feeling well, and she states you do not want her to see the PA and she said that she will go to the ER if they can not see her any earlier and want to know if we can try to Expedite her to get in with Dr. Arlyce Dice earlier cause this is important and she isnt sure if this is an issue with her stomach or her hernia.

## 2012-12-30 ENCOUNTER — Ambulatory Visit: Payer: 59 | Admitting: Gastroenterology

## 2012-12-31 NOTE — Telephone Encounter (Signed)
Saw that pt has an appt already for 2/17 Dr. Melvia Heaps

## 2013-01-03 ENCOUNTER — Ambulatory Visit (INDEPENDENT_AMBULATORY_CARE_PROVIDER_SITE_OTHER): Payer: 59 | Admitting: Gastroenterology

## 2013-01-03 ENCOUNTER — Encounter: Payer: Self-pay | Admitting: Gastroenterology

## 2013-01-03 VITALS — BP 142/88 | HR 80 | Ht 63.5 in | Wt 143.4 lb

## 2013-01-03 DIAGNOSIS — R198 Other specified symptoms and signs involving the digestive system and abdomen: Secondary | ICD-10-CM

## 2013-01-03 DIAGNOSIS — R194 Change in bowel habit: Secondary | ICD-10-CM | POA: Insufficient documentation

## 2013-01-03 NOTE — Patient Instructions (Addendum)
You have been scheduled for a colonoscopy with propofol. Please follow written instructions given to you at your visit today.  Please pick up your prep kit at the pharmacy within the next 1-3 days. If you use inhalers (even only as needed) or a CPAP machine, please bring them with you on the day of your procedure.  

## 2013-01-03 NOTE — Assessment & Plan Note (Addendum)
Issue is several month history of  change in bowel habits associated with decreased caliber stools and crampy lower bowel pain, weight loss. No obvious abnormalities by CT scan. A structural abnormality of the colon should be ruled out.  Recommendations #1 colonoscopy

## 2013-01-03 NOTE — Progress Notes (Signed)
History of Present Illness: Pleasant 57 year old Afro-American female with history of cervical CA, status post surgery with lymph node dissection, recurrent incisional hernias, referred for evaluation of change in bowel habits. For several months she's noted mild constipation characterized by passage of small caliber stools. This has been accompanied by crampy lower abdominal pain. On one occasion she saw minimal amounts of blood on the toilet water. Appetite has been decreased and she has lost about 15 pounds. Last colonoscopy 5 or 6 years ago apparently was normal  Recent CT scan, which I reviewed, did not demonstrate any significant abdominal or pelvic findings.    Past Medical History  Diagnosis Date  . Hypertension   . Hyperlipidemia   . Depression   . Sleep difficulties   . Cervical cancer 1983   Past Surgical History  Procedure Laterality Date  . Cervical disc surgery    . Back surgery    . Elbow surgery      rt elbow  . Abdominal hysterectomy    . Bowel obstruction    . Hernia repair  06/20/2011    Lap VWH repair Carman Ching, MD)  . Tonsillectomy  06/14/2012    Procedure: TONSILLECTOMY;  Surgeon: Melvenia Beam, MD;  Location: [facility] OR;  Service: ENT;  Laterality: Bilateral;   family history includes Heart disease in her father and mother; Lung cancer in her mother; and Mental illness in her brother. Current Outpatient Prescriptions  Medication Sig Dispense Refill  . ARIPiprazole (ABILIFY) 5 MG tablet Take 5 mg by mouth daily.      Marland Kitchen desvenlafaxine (PRISTIQ) 50 MG 24 hr tablet Take 50 mg by mouth daily.      . traZODone (DESYREL) 100 MG tablet Take 150 mg by mouth at bedtime.        No current facility-administered medications for this visit.   Allergies as of 01/03/2013 - Review Complete 01/03/2013  Allergen Reaction Noted  . Codeine Itching 04/08/2011  . Percocet (oxycodone-acetaminophen) Itching 07/07/2011  . Hydrocodone Itching 06/14/2012    reports that she has been  smoking Cigarettes.  She has a 2.5 pack-year smoking history. She has never used smokeless tobacco. She reports that  drinks alcohol. She reports that she does not use illicit drugs.     Review of Systems: Pertinent positive and negative review of systems were noted in the above HPI section. All other review of systems were otherwise negative.  Vital signs were reviewed in today's medical record Physical Exam: General: Well developed , well nourished, no acute distress Skin: anicteric Head: Normocephalic and atraumatic Eyes:  sclerae anicteric, EOMI Ears: Normal auditory acuity Mouth: No deformity or lesions Neck: Supple, no masses or thyromegaly Lungs: Clear throughout to auscultation Heart: Regular rate and rhythm; no murmurs, rubs or bruits Abdomen: Soft, non tender and non distended. No masses, hepatosplenomegaly or hernias noted. Normal Bowel sounds.  A small ventral hernia is present just inferior to the umbilicus  Rectal:deferred Musculoskeletal: Symmetrical with no gross deformities  Skin: No lesions on visible extremities Pulses:  Normal pulses noted Extremities: No clubbing, cyanosis, edema or deformities noted Neurological: Alert oriented x 4, grossly nonfocal Cervical Nodes:  No significant cervical adenopathy Inguinal Nodes: No significant inguinal adenopathy Psychological:  Alert and cooperative. Normal mood and affect

## 2013-01-04 ENCOUNTER — Encounter: Payer: Self-pay | Admitting: Gastroenterology

## 2013-01-04 ENCOUNTER — Ambulatory Visit (AMBULATORY_SURGERY_CENTER): Payer: 59 | Admitting: Gastroenterology

## 2013-01-04 VITALS — BP 155/85 | HR 75 | Temp 97.6°F | Resp 19 | Ht 63.5 in | Wt 143.0 lb

## 2013-01-04 DIAGNOSIS — D126 Benign neoplasm of colon, unspecified: Secondary | ICD-10-CM

## 2013-01-04 DIAGNOSIS — R198 Other specified symptoms and signs involving the digestive system and abdomen: Secondary | ICD-10-CM

## 2013-01-04 MED ORDER — SODIUM CHLORIDE 0.9 % IV SOLN: 500.0000 mL | INTRAVENOUS | Status: DC

## 2013-01-04 NOTE — Patient Instructions (Addendum)
YOU HAD AN ENDOSCOPIC PROCEDURE TODAY AT THE [facility]: Refer to the procedure report that was given to you for any specific questions about what was found during the examination.  If the procedure report does not answer your questions, please call your gastroenterologist to clarify.  If you requested that your care partner not be given the details of your procedure findings, then the procedure report has been included in a sealed envelope for you to review at your convenience later.  YOU SHOULD EXPECT: Some feelings of bloating in the abdomen. Passage of more gas than usual.  Walking can help get rid of the air that was put into your GI tract during the procedure and reduce the bloating. If you had a lower endoscopy (such as a colonoscopy or flexible sigmoidoscopy) you may notice spotting of blood in your stool or on the toilet paper. If you underwent a bowel prep for your procedure, then you may not have a normal bowel movement for a few days.  DIET: Your first meal following the procedure should be a light meal and then it is ok to progress to your normal diet.  A half-sandwich or bowl of soup is an example of a good first meal.  Heavy or fried foods are harder to digest and may make you feel nauseous or bloated.  Likewise meals heavy in dairy and vegetables can cause extra gas to form and this can also increase the bloating.  Drink plenty of fluids but you should avoid alcoholic beverages for 24 hours.  ACTIVITY: Your care partner should take you home directly after the procedure.  You should plan to take it easy, moving slowly for the rest of the day.  You can resume normal activity the day after the procedure however you should NOT DRIVE or use heavy machinery for 24 hours (because of the sedation medicines used during the test).    SYMPTOMS TO REPORT IMMEDIATELY: A gastroenterologist can be reached at any hour.  During normal business hours, 8:30 AM to 5:00 PM Monday through Friday,  call [phone].  After hours and on weekends, please call the GI answering service at [phone] who will take a message and have the physician on call contact you.   Following lower endoscopy (colonoscopy or flexible sigmoidoscopy):  Excessive amounts of blood in the stool  Significant tenderness or worsening of abdominal pains  Swelling of the abdomen that is new, acute  Fever of 100F or higher    FOLLOW UP: If any biopsies were taken you will be contacted by phone or by letter within the next 1-3 weeks.  Call your gastroenterologist if you have not heard about the biopsies in 3 weeks.  Our staff will call the home number listed on your records the next business day following your procedure to check on you and address any questions or concerns that you may have at that time regarding the information given to you following your procedure. This is a courtesy call and so if there is no answer at the home number and we have not heard from you through the emergency physician on call, we will assume that you have returned to your regular daily activities without incident.  SIGNATURES/CONFIDENTIALITY: You and/or your care partner have signed paperwork which will be entered into your electronic medical record.  These signatures attest to the fact that that the information above on your After Visit Summary has been reviewed and is understood.  Full responsibility of the confidentiality   of this discharge information lies with you and/or your care-partner.   Information on Polyps & Hemorrhoids given to you today   Follow up with Dr. Arlyce Dice in his office in 4-6 weeks, please make this appointment

## 2013-01-04 NOTE — Progress Notes (Signed)
A/o x3 pleased report to Penny RN 

## 2013-01-04 NOTE — Progress Notes (Signed)
Patient did not experience any of the following events: a burn prior to discharge; a fall within the facility; wrong site/side/patient/procedure/implant event; or a hospital transfer or hospital admission upon discharge from the facility. (G8907) Patient did not have preoperative order for IV antibiotic SSI prophylaxis. (G8918)  

## 2013-01-04 NOTE — Op Note (Signed)
[facility] [address] [place], [address]   COLONOSCOPY PROCEDURE REPORT  PATIENT: Maria Dougherty, Maria Dougherty  MR#: [id number] BIRTHDATE: 05/21/1956 , 56  yrs. old GENDER: Female ENDOSCOPIST: Louis Meckel, MD REFERRED XB:JYNW Susann Givens, M.D. PROCEDURE DATE:  01/04/2013 PROCEDURE:   Colonoscopy with snare polypectomy ASA CLASS:   Class II INDICATIONS:Change in bowel habits. MEDICATIONS: MAC sedation, administered by CRNA and Propofol (Diprivan) 370 mg IV  DESCRIPTION OF PROCEDURE:   After the risks benefits and alternatives of the procedure were thoroughly explained, informed consent was obtained.  A digital rectal exam revealed no abnormalities of the rectum.   The LB [id number]  endoscope was introduced through the anus and advanced to the cecum, which was identified by both the appendix and ileocecal valve. No adverse events experienced.   The quality of the prep was Suprep excellent The instrument was then slowly withdrawn as the colon was fully examined.      COLON FINDINGS: A sessile polyp measuring 3 mm in size was found in the descending colon.  A polypectomy was performed with a cold snare.  The resection was complete and the polyp tissue was completely retrieved.   A sessile polyp measuring 3 mm in size was found in the sigmoid colon.  A polypectomy was performed with a cold snare.  The resection was complete and the polyp tissue was not retrieved.   Internal hemorrhoids were found.   The colon mucosa was otherwise normal.  Retroflexed views revealed no abnormalities. The time to cecum=4 minutes 10 seconds.  Withdrawal time=17 minutes 54 seconds.  The scope was withdrawn and the procedure completed. COMPLICATIONS: There were no complications.  ENDOSCOPIC IMPRESSION: 1.   Sessile polyp measuring 3 mm in size was found in the descending colon; polypectomy was performed with a cold snare 2.   Sessile polyp measuring 3 mm in size was found in  the sigmoid colon; polypectomy was performed with a cold snare 3.   Internal hemorrhoids 4.   The colon mucosa was otherwise normal  RECOMMENDATIONS: 1.  If the polyp(s) removed today are proven to be adenomatous (pre-cancerous) polyps, you will need a repeat colonoscopy in 5 years.  Otherwise you should continue to follow colorectal cancer screening guidelines for "routine risk" patients with colonoscopy in 10 years.  You will receive a letter within 1-2 weeks with the results of your biopsy as well as final recommendations.  Please call my office if you have not received a letter after 3 weeks. 2.  High fiber diet with liberal fluid intake. 3.  OV 4-6 weeks     eSigned:  Louis Meckel, MD 01/04/2013 12:18 PM   cc:   PATIENT NAME:  Maria Dougherty, Maria Dougherty MR#: [id number]

## 2013-01-05 ENCOUNTER — Telehealth: Payer: Self-pay | Admitting: *Deleted

## 2013-01-05 NOTE — Telephone Encounter (Signed)
Called pt back, advised pt of fiber supplement daily and follow up with office visit in 4-6 weeks, hold off surgery until re-evaluated

## 2013-01-05 NOTE — Telephone Encounter (Signed)
Fiber supplementation qd OV 4-6 weeks Hold off surgery until reevaluated by GI

## 2013-01-05 NOTE — Telephone Encounter (Signed)
  Follow up Call-  Call back number 01/04/2013  Post procedure Call Back phone  # [phone]  Permission to leave phone message Yes     Patient questions:  Do you have a fever, pain , or abdominal swelling? no Pain Score  0 *  Have you tolerated food without any problems? yes  Have you been able to return to your normal activities? yes  Do you have any questions about your discharge instructions: Diet   no Medications  no Follow up visit  yes  Do you have questions or concerns about your Care? yes  Actions: * If pain score is 4 or above: Physician/ provider Notified : Melvia Heaps, MD  Patient has questions about follow-up. Pt came in for weight loss, loose stools, and loss of appetite. Pt wants to know where to go from here before you see the surgeon for her hernia repair.   Also she would like to extend her appreciation for compassion of the office, and she felt like she was in great hands.

## 2013-01-11 ENCOUNTER — Encounter: Payer: Self-pay | Admitting: Gastroenterology

## 2013-01-20 ENCOUNTER — Encounter: Payer: Self-pay | Admitting: Gastroenterology

## 2013-02-02 ENCOUNTER — Encounter: Payer: Self-pay | Admitting: Gastroenterology

## 2013-02-02 ENCOUNTER — Ambulatory Visit (INDEPENDENT_AMBULATORY_CARE_PROVIDER_SITE_OTHER): Payer: 59 | Admitting: Gastroenterology

## 2013-02-02 VITALS — BP 146/70 | HR 76 | Ht 63.75 in | Wt 145.0 lb

## 2013-02-02 DIAGNOSIS — K432 Incisional hernia without obstruction or gangrene: Secondary | ICD-10-CM

## 2013-02-02 DIAGNOSIS — R194 Change in bowel habit: Secondary | ICD-10-CM

## 2013-02-02 DIAGNOSIS — R198 Other specified symptoms and signs involving the digestive system and abdomen: Secondary | ICD-10-CM

## 2013-02-02 NOTE — Progress Notes (Signed)
History of Present Illness:  This is a return office visit following colonoscopy. Exam demonstrated a small hyperplastic polyp only. On a regimen of daily Metamucil bowels are becoming more regular and larger caliber. She has no other GI complaints.    Review of Systems: Pertinent positive and negative review of systems were noted in the above HPI section. All other review of systems were otherwise negative.    Current Medications, Allergies, Past Medical History, Past Surgical History, Family History and Social History were reviewed in [employer] electronic medical record  Vital signs were reviewed in today's medical record. Physical Exam: General: Well developed , well nourished, no acute distress Skin: anicteric

## 2013-02-02 NOTE — Assessment & Plan Note (Signed)
Patient claims that hernia is slowly enlarging and she occasionally has abdominal pain. Plan is to have the hernia repaired.

## 2013-02-02 NOTE — Patient Instructions (Addendum)
You have been given a separate informational sheet regarding your tobacco use, the importance of quitting and local resources to help you quit.  Follow up as needed 

## 2013-02-02 NOTE — Assessment & Plan Note (Signed)
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 Plan to continue with the same.

## 2013-02-03 ENCOUNTER — Encounter: Payer: Self-pay | Admitting: Family Medicine

## 2013-06-13 ENCOUNTER — Ambulatory Visit (INDEPENDENT_AMBULATORY_CARE_PROVIDER_SITE_OTHER): Payer: 59 | Admitting: Surgery

## 2013-06-13 ENCOUNTER — Encounter (INDEPENDENT_AMBULATORY_CARE_PROVIDER_SITE_OTHER): Payer: Self-pay | Admitting: Surgery

## 2013-06-13 VITALS — BP 118/68 | HR 64 | Temp 97.9°F | Resp 15 | Ht 63.5 in | Wt 156.2 lb

## 2013-06-13 DIAGNOSIS — K432 Incisional hernia without obstruction or gangrene: Secondary | ICD-10-CM

## 2013-06-13 NOTE — Progress Notes (Signed)
Subjective:     Patient ID: Maria Dougherty, female   DOB: May 21, 1956, 57 y.o.   MRN: [id number]  HPI She is here for a followup visit regarding her incisional hernia. She has seen Dr. Arlyce Dice and had a colonoscopy which showed only a small benign polyp. He has her on a bowel regimen for her constipation. She thinks the hernia has gotten slightly larger. She still has occasional cramping abdominal pain and constipation  Review of Systems     Objective:   Physical Exam There is a reducible hernia at the lower aspect of her incision. It is nontender    Assessment:     Symptomatic incisional hernia     Plan:     Repair with mesh was recommended that she is symptomatic. His colon is intermittently becoming involved this may be a source of her constipation. I discussed laps covered repair with mesh. She is in agreement and wishes to proceed. Surgery will be scheduled

## 2013-06-14 ENCOUNTER — Encounter (HOSPITAL_COMMUNITY): Payer: Self-pay | Admitting: Pharmacy Technician

## 2013-06-21 ENCOUNTER — Encounter (HOSPITAL_COMMUNITY)
Admission: RE | Admit: 2013-06-21 | Discharge: 2013-06-21 | Disposition: A | Discharge status: Home / Self Care | Payer: 59 | Source: Ambulatory Visit | Attending: Surgery | Admitting: Surgery

## 2013-06-21 ENCOUNTER — Ambulatory Visit (HOSPITAL_COMMUNITY)
Admission: RE | Admit: 2013-06-21 | Discharge: 2013-06-21 | Disposition: A | Discharge status: Home / Self Care | Payer: 59 | Source: Ambulatory Visit | Attending: Surgery | Admitting: Surgery

## 2013-06-21 ENCOUNTER — Encounter (HOSPITAL_COMMUNITY): Payer: Self-pay

## 2013-06-21 DIAGNOSIS — Z01818 Encounter for other preprocedural examination: Secondary | ICD-10-CM | POA: Insufficient documentation

## 2013-06-21 DIAGNOSIS — Z01810 Encounter for preprocedural cardiovascular examination: Secondary | ICD-10-CM | POA: Insufficient documentation

## 2013-06-21 DIAGNOSIS — Z01812 Encounter for preprocedural laboratory examination: Secondary | ICD-10-CM | POA: Insufficient documentation

## 2013-06-21 LAB — BASIC METABOLIC PANEL
BUN: 16 mg/dL (ref 6–23)
CO2: 22 mEq/L (ref 19–32)
Chloride: 103 mEq/L (ref 96–112)
GFR calc Af Amer: 84 mL/min — ABNORMAL LOW (ref >90)
Potassium: 4.7 mEq/L (ref 3.5–5.1)

## 2013-06-21 LAB — CBC
MCH: 31.3 pg (ref 26.0–34.0)
MCHC: 35.8 g/dL (ref 30.0–36.0)
MCV: 87.4 fL (ref 78.0–100.0)
Platelets: 231 10*3/uL (ref 150–400)
RDW: 12.7 % (ref 11.5–15.5)

## 2013-06-21 NOTE — Pre-Procedure Instructions (Signed)
Maria Dougherty  06/21/2013   Your procedure is scheduled on:  Monday, August 11th  Report to [facility] at 1045 AM.  Call this number if you have problems the morning of surgery: [phone]   Remember:   Do not eat food or drink liquids after midnight.   Take these medicines the morning of surgery with A SIP OF WATER: none   Do not wear jewelry, make-up or nail polish.  Do not wear lotions, powders, or perfumes,deodorant.  Do not shave 48 hours prior to surgery.   Do not bring valuables to the hospital.  [facility] is not responsible for any belongings or valuables.  Contacts, dentures or bridgework may not be worn into surgery.  Leave suitcase in the car. After surgery it may be brought to your room.  For patients admitted to the hospital, checkout time is 11:00 AM the day of discharge.   Patients discharged the day of surgery will not be allowed to drive home.    Special Instructions: Shower using CHG 2 nights before surgery and the night before surgery.  If you shower the day of surgery use CHG.  Use special wash - you have one bottle of CHG for all showers.  You should use approximately 1/3 of the bottle for each shower.   Please read over the following fact sheets that you were given: Pain Booklet, Coughing and Deep Breathing and Surgical Site Infection Prevention

## 2013-06-26 MED ORDER — CEFAZOLIN SODIUM-DEXTROSE 2-3 GM-% IV SOLR
2.0000 g | INTRAVENOUS | Status: AC
  Administered 2013-06-27: 2 g via INTRAVENOUS
  Filled 2013-06-26: qty 50

## 2013-06-26 NOTE — H&P (Signed)
Maria Dougherty is an 57 y.o. female.   Chief Complaint: recurrent incisional hernia HPI: this is a very pleasant female who I have operated on in the past who has a recurrent incisional hernia that is symptomatic.  She has had a recent colonoscopy that is otherwise unremarkable.  She is having some pain and constipation.  The pain is in the lower abdomen  Past Medical History  Diagnosis Date  . Hyperlipidemia   . Depression   . Sleep difficulties   . Cervical cancer 1983  . Hypertension     no meds now    dr Harley Alto    Past Surgical History  Procedure Laterality Date  . Cervical disc surgery    . Back surgery    . Elbow surgery      rt elbow  . Abdominal hysterectomy    . Bowel obstruction    . Hernia repair  06/20/2011    Lap VWH repair Carman Ching, MD)  . Tonsillectomy  06/14/2012    Procedure: TONSILLECTOMY;  Surgeon: Melvenia Beam, MD;  Location: [facility] OR;  Service: ENT;  Laterality: Bilateral;    Family History  Problem Relation Age of Onset  . Heart disease Mother   . Lung cancer Mother   . Heart disease Father   . Mental illness Brother    Social History:  reports that she has been smoking Cigarettes.  She has a 2.5 pack-year smoking history. She has never used smokeless tobacco. She reports that  drinks alcohol. She reports that she does not use illicit drugs.  Allergies:  Allergies  Allergen Reactions  . Codeine Itching    All over the body.  . Percocet (Oxycodone-Acetaminophen) Itching    All over body. "All -cets"  . Hydrocodone Itching    No prescriptions prior to admission    No results found for this or any previous visit (from the past 48 hour(s)). No results found.  Review of Systems  All other systems reviewed and are negative.    There were no vitals taken for this visit. Physical Exam  Constitutional: She is oriented to person, place, and time. She appears well-developed and well-nourished. No distress.  HENT:  Head: Normocephalic and  atraumatic.  Eyes: Conjunctivae are normal. Pupils are equal, round, and reactive to light.  Neck: Normal range of motion. Neck supple.  Cardiovascular: Normal rate, regular rhythm and normal heart sounds.   Respiratory: Effort normal and breath sounds normal. No respiratory distress. She has no wheezes.  GI: Soft. Bowel sounds are normal. She exhibits no distension. There is no tenderness.  Recurrent lower abdomina hernia  Musculoskeletal: Normal range of motion.  Neurological: She is alert and oriented to person, place, and time.  Skin: Skin is warm and dry. No erythema.  Psychiatric: Her behavior is normal.     Assessment/Plan Recurrent incisional hernia  Repair with mesh is again recommended.  I will attempt this laparoscopically.  I discussed the risks which include, but are not limited to bleeding, infection, injury to the bowel, need to convert to an open procedure, recurrence, use on mesh, etc.  She understands and agrees to proceed.  Ayaan Shutes A 06/26/2013, 6:12 PM

## 2013-06-27 ENCOUNTER — Encounter (HOSPITAL_COMMUNITY)
Admission: RE | Disposition: A | Discharge status: Home / Self Care | Payer: Self-pay | Source: Ambulatory Visit | Attending: Surgery

## 2013-06-27 ENCOUNTER — Encounter (HOSPITAL_COMMUNITY): Payer: Self-pay | Admitting: Anesthesiology

## 2013-06-27 ENCOUNTER — Ambulatory Visit (HOSPITAL_COMMUNITY): Payer: 59 | Admitting: Anesthesiology

## 2013-06-27 ENCOUNTER — Encounter (HOSPITAL_COMMUNITY): Payer: Self-pay | Admitting: *Deleted

## 2013-06-27 ENCOUNTER — Observation Stay (HOSPITAL_COMMUNITY)
Admission: RE | Admit: 2013-06-27 | Discharge: 2013-07-01 | Disposition: A | Discharge status: Home / Self Care | Payer: 59 | Source: Ambulatory Visit | Attending: Surgery | Admitting: Surgery

## 2013-06-27 DIAGNOSIS — IMO0002 Reserved for concepts with insufficient information to code with codable children: Secondary | ICD-10-CM | POA: Insufficient documentation

## 2013-06-27 DIAGNOSIS — Y838 Other surgical procedures as the cause of abnormal reaction of the patient, or of later complication, without mention of misadventure at the time of the procedure: Secondary | ICD-10-CM | POA: Insufficient documentation

## 2013-06-27 DIAGNOSIS — K430 Incisional hernia with obstruction, without gangrene: Secondary | ICD-10-CM

## 2013-06-27 DIAGNOSIS — N9989 Other postprocedural complications and disorders of genitourinary system: Secondary | ICD-10-CM | POA: Insufficient documentation

## 2013-06-27 DIAGNOSIS — K432 Incisional hernia without obstruction or gangrene: Principal | ICD-10-CM | POA: Diagnosis present

## 2013-06-27 DIAGNOSIS — R338 Other retention of urine: Secondary | ICD-10-CM | POA: Insufficient documentation

## 2013-06-27 DIAGNOSIS — N390 Urinary tract infection, site not specified: Secondary | ICD-10-CM | POA: Insufficient documentation

## 2013-06-27 HISTORY — PX: INCISIONAL HERNIA REPAIR: SHX193

## 2013-06-27 HISTORY — PX: INSERTION OF MESH: SHX5868

## 2013-06-27 SURGERY — REPAIR, HERNIA, INCISIONAL, LAPAROSCOPIC
Anesthesia: General | Site: Abdomen | Wound class: Clean

## 2013-06-27 MED ORDER — 0.9 % SODIUM CHLORIDE (POUR BTL) OPTIME
TOPICAL | Status: DC | PRN
  Administered 2013-06-27: 1000 mL

## 2013-06-27 MED ORDER — BUPIVACAINE-EPINEPHRINE 0.25% -1:200000 IJ SOLN
INTRAMUSCULAR | Status: DC | PRN
  Administered 2013-06-27: 20 mL

## 2013-06-27 MED ORDER — LIDOCAINE HCL (CARDIAC) 20 MG/ML IV SOLN
INTRAVENOUS | Status: DC | PRN
  Administered 2013-06-27: 50 mg via INTRAVENOUS

## 2013-06-27 MED ORDER — ENOXAPARIN SODIUM 40 MG/0.4ML ~~LOC~~ SOLN
40.0000 mg | SUBCUTANEOUS | Status: DC
  Administered 2013-06-28 – 2013-06-29 (×2): 40 mg via SUBCUTANEOUS
  Filled 2013-06-27 (×5): qty 0.4

## 2013-06-27 MED ORDER — GLYCOPYRROLATE 0.2 MG/ML IJ SOLN
INTRAMUSCULAR | Status: DC | PRN
  Administered 2013-06-27 (×2): .2 mg via INTRAVENOUS

## 2013-06-27 MED ORDER — ONDANSETRON HCL 4 MG/2ML IJ SOLN: 4.0000 mg | Freq: Four times a day (QID) | INTRAMUSCULAR | Status: DC | PRN

## 2013-06-27 MED ORDER — PROPOFOL 10 MG/ML IV BOLUS
INTRAVENOUS | Status: DC | PRN
  Administered 2013-06-27: 200 mg via INTRAVENOUS

## 2013-06-27 MED ORDER — DIPHENHYDRAMINE HCL 25 MG PO CAPS
25.0000 mg | ORAL_CAPSULE | Freq: Four times a day (QID) | ORAL | Status: DC | PRN
  Administered 2013-06-28: 25 mg via ORAL
  Filled 2013-06-27 (×2): qty 1

## 2013-06-27 MED ORDER — SODIUM CHLORIDE 0.9 % IR SOLN
Status: DC | PRN
  Administered 2013-06-27: 1000 mL

## 2013-06-27 MED ORDER — MORPHINE SULFATE 4 MG/ML IJ SOLN
INTRAMUSCULAR | Status: AC
  Filled 2013-06-27: qty 1

## 2013-06-27 MED ORDER — MIDAZOLAM HCL 5 MG/5ML IJ SOLN
INTRAMUSCULAR | Status: DC | PRN
  Administered 2013-06-27: 2 mg via INTRAVENOUS

## 2013-06-27 MED ORDER — ARIPIPRAZOLE 5 MG PO TABS
5.0000 mg | ORAL_TABLET | Freq: Every day | ORAL | Status: DC
  Administered 2013-06-28 – 2013-06-30 (×4): 5 mg via ORAL
  Filled 2013-06-27 (×5): qty 1

## 2013-06-27 MED ORDER — ROCURONIUM BROMIDE 100 MG/10ML IV SOLN
INTRAVENOUS | Status: DC | PRN
  Administered 2013-06-27: 40 mg via INTRAVENOUS

## 2013-06-27 MED ORDER — ONDANSETRON HCL 4 MG/2ML IJ SOLN
INTRAMUSCULAR | Status: DC | PRN
  Administered 2013-06-27: 4 mg via INTRAVENOUS

## 2013-06-27 MED ORDER — HYDROMORPHONE HCL PF 1 MG/ML IJ SOLN
0.2500 mg | INTRAMUSCULAR | Status: DC | PRN
  Administered 2013-06-27: 0.5 mg via INTRAVENOUS
  Administered 2013-06-27: 1 mg via INTRAVENOUS

## 2013-06-27 MED ORDER — LACTATED RINGERS IV SOLN
INTRAVENOUS | Status: DC
  Administered 2013-06-27 (×2): via INTRAVENOUS

## 2013-06-27 MED ORDER — DIPHENHYDRAMINE HCL 25 MG PO CAPS: 25.0000 mg | ORAL_CAPSULE | Freq: Four times a day (QID) | ORAL | Status: DC | PRN

## 2013-06-27 MED ORDER — TRAMADOL HCL 50 MG PO TABS
100.0000 mg | ORAL_TABLET | Freq: Four times a day (QID) | ORAL | Status: DC | PRN
  Administered 2013-06-27 – 2013-06-28 (×2): 100 mg via ORAL
  Filled 2013-06-27: qty 2

## 2013-06-27 MED ORDER — MORPHINE SULFATE 4 MG/ML IJ SOLN
4.0000 mg | INTRAMUSCULAR | Status: DC | PRN
Start: 2013-06-27 — End: 2013-07-01
  Administered 2013-06-27 – 2013-06-29 (×7): 4 mg via INTRAVENOUS
  Filled 2013-06-27 (×8): qty 1

## 2013-06-27 MED ORDER — BUPIVACAINE-EPINEPHRINE PF 0.25-1:200000 % IJ SOLN
INTRAMUSCULAR | Status: AC
  Filled 2013-06-27: qty 30

## 2013-06-27 MED ORDER — FENTANYL CITRATE 0.05 MG/ML IJ SOLN
INTRAMUSCULAR | Status: DC | PRN
  Administered 2013-06-27: 50 ug via INTRAVENOUS
  Administered 2013-06-27 (×2): 100 ug via INTRAVENOUS

## 2013-06-27 MED ORDER — TRAZODONE HCL 150 MG PO TABS
300.0000 mg | ORAL_TABLET | Freq: Every day | ORAL | Status: DC
  Administered 2013-06-28 – 2013-06-30 (×4): 300 mg via ORAL
  Filled 2013-06-27 (×5): qty 2

## 2013-06-27 MED ORDER — DIPHENHYDRAMINE HCL 50 MG/ML IJ SOLN
12.5000 mg | Freq: Four times a day (QID) | INTRAMUSCULAR | Status: DC | PRN
  Administered 2013-06-27: 12.5 mg via INTRAVENOUS
  Filled 2013-06-27 (×2): qty 1

## 2013-06-27 MED ORDER — NEOSTIGMINE METHYLSULFATE 1 MG/ML IJ SOLN
INTRAMUSCULAR | Status: DC | PRN
  Administered 2013-06-27: 3 mg via INTRAVENOUS

## 2013-06-27 MED ORDER — HYDROMORPHONE HCL PF 1 MG/ML IJ SOLN
INTRAMUSCULAR | Status: AC
  Filled 2013-06-27: qty 1

## 2013-06-27 MED ORDER — ONDANSETRON HCL 4 MG PO TABS: 4.0000 mg | ORAL_TABLET | Freq: Four times a day (QID) | ORAL | Status: DC | PRN

## 2013-06-27 MED ORDER — TRAMADOL HCL 50 MG PO TABS
ORAL_TABLET | ORAL | Status: AC
  Filled 2013-06-27: qty 2

## 2013-06-27 MED ORDER — HYDROMORPHONE HCL PF 1 MG/ML IJ SOLN
1.0000 mg | INTRAMUSCULAR | Status: DC | PRN
  Administered 2013-06-27 – 2013-07-01 (×4): 1 mg via INTRAVENOUS
  Filled 2013-06-27 (×5): qty 1

## 2013-06-27 MED ORDER — DIPHENHYDRAMINE HCL 50 MG/ML IJ SOLN: 12.5000 mg | Freq: Four times a day (QID) | INTRAMUSCULAR | Status: DC | PRN

## 2013-06-27 MED ORDER — DEXAMETHASONE SODIUM PHOSPHATE 4 MG/ML IJ SOLN
INTRAMUSCULAR | Status: DC | PRN
  Administered 2013-06-27: 4 mg via INTRAVENOUS

## 2013-06-27 MED ORDER — HYDROMORPHONE HCL PF 1 MG/ML IJ SOLN
INTRAMUSCULAR | Status: AC
  Administered 2013-06-27 (×2): 0.5 mg via INTRAVENOUS
  Filled 2013-06-27: qty 1

## 2013-06-27 MED ORDER — IBUPROFEN 600 MG PO TABS: 600.0000 mg | ORAL_TABLET | Freq: Four times a day (QID) | ORAL | Status: DC | PRN

## 2013-06-27 SURGICAL SUPPLY — 49 items
APL SKNCLS STERI-STRIP NONHPOA (GAUZE/BANDAGES/DRESSINGS) ×1
APPLIER CLIP 5 13 M/L LIGAMAX5 (MISCELLANEOUS)
APPLIER CLIP ROT 10 11.4 M/L (STAPLE)
APR CLP MED LRG 11.4X10 (STAPLE)
APR CLP MED LRG 5 ANG JAW (MISCELLANEOUS)
BANDAGE ADHESIVE 1X3 (GAUZE/BANDAGES/DRESSINGS) ×3 IMPLANT
BENZOIN TINCTURE PRP APPL 2/3 (GAUZE/BANDAGES/DRESSINGS) ×2 IMPLANT
BLADE SURG ROTATE 9660 (MISCELLANEOUS) ×1 IMPLANT
CANISTER SUCTION 2500CC (MISCELLANEOUS) ×1 IMPLANT
CHLORAPREP W/TINT 26ML (MISCELLANEOUS) ×2 IMPLANT
CLIP APPLIE 5 13 M/L LIGAMAX5 (MISCELLANEOUS) IMPLANT
CLIP APPLIE ROT 10 11.4 M/L (STAPLE) IMPLANT
CLOTH BEACON ORANGE TIMEOUT ST (SAFETY) ×2 IMPLANT
COVER SURGICAL LIGHT HANDLE (MISCELLANEOUS) ×2 IMPLANT
DECANTER SPIKE VIAL GLASS SM (MISCELLANEOUS) ×1 IMPLANT
DEVICE SECURE STRAP 25 ABSORB (INSTRUMENTS) ×2 IMPLANT
DEVICE TROCAR PUNCTURE CLOSURE (ENDOMECHANICALS) ×2 IMPLANT
ELECT REM PT RETURN 9FT ADLT (ELECTROSURGICAL) ×2
ELECTRODE REM PT RTRN 9FT ADLT (ELECTROSURGICAL) ×1 IMPLANT
GLOVE BIO SURGEON STRL SZ7.5 (GLOVE) ×1 IMPLANT
GLOVE BIOGEL PI IND STRL 7.0 (GLOVE) IMPLANT
GLOVE BIOGEL PI IND STRL 7.5 (GLOVE) IMPLANT
GLOVE BIOGEL PI INDICATOR 7.0 (GLOVE) ×3
GLOVE BIOGEL PI INDICATOR 7.5 (GLOVE) ×1
GLOVE SURG SIGNA 7.5 PF LTX (GLOVE) ×2 IMPLANT
GLOVE SURG SS PI 7.0 STRL IVOR (GLOVE) ×2 IMPLANT
GOWN STRL NON-REIN LRG LVL3 (GOWN DISPOSABLE) ×4 IMPLANT
GOWN STRL REIN XL XLG (GOWN DISPOSABLE) ×2 IMPLANT
KIT BASIN OR (CUSTOM PROCEDURE TRAY) ×2 IMPLANT
KIT ROOM TURNOVER OR (KITS) ×2 IMPLANT
MARKER SKIN DUAL TIP RULER LAB (MISCELLANEOUS) ×2 IMPLANT
MESH VENTRALIGHT ST 4.5IN (Mesh General) ×1 IMPLANT
NDL SPNL 22GX3.5 QUINCKE BK (NEEDLE) ×1 IMPLANT
NEEDLE SPNL 22GX3.5 QUINCKE BK (NEEDLE) ×2 IMPLANT
NS IRRIG 1000ML POUR BTL (IV SOLUTION) ×2 IMPLANT
PAD ARMBOARD 7.5X6 YLW CONV (MISCELLANEOUS) ×4 IMPLANT
SCALPEL HARMONIC ACE (MISCELLANEOUS) ×1 IMPLANT
SCISSORS LAP 5X35 DISP (ENDOMECHANICALS) IMPLANT
SET IRRIG TUBING LAPAROSCOPIC (IRRIGATION / IRRIGATOR) ×1 IMPLANT
SLEEVE ENDOPATH XCEL 5M (ENDOMECHANICALS) ×2 IMPLANT
SUT MON AB 4-0 PC3 18 (SUTURE) ×2 IMPLANT
SUT NOVA NAB GS-21 0 18 T12 DT (SUTURE) ×3 IMPLANT
TOWEL OR 17X24 6PK STRL BLUE (TOWEL DISPOSABLE) ×3 IMPLANT
TOWEL OR 17X26 10 PK STRL BLUE (TOWEL DISPOSABLE) ×1 IMPLANT
TRAY FOLEY CATH 16FR SILVER (SET/KITS/TRAYS/PACK) ×1 IMPLANT
TRAY FOLEY CATH 16FRSI W/METER (SET/KITS/TRAYS/PACK) IMPLANT
TRAY LAPAROSCOPIC (CUSTOM PROCEDURE TRAY) ×2 IMPLANT
TROCAR XCEL NON-BLD 11X100MML (ENDOMECHANICALS) ×2 IMPLANT
TROCAR XCEL NON-BLD 5MMX100MML (ENDOMECHANICALS) ×2 IMPLANT

## 2013-06-27 NOTE — Anesthesia Procedure Notes (Signed)
Procedure Name: Intubation Date/Time: 06/27/2013 12:06 PM Performed by: Leona Singleton A Pre-anesthesia Checklist: Patient identified, Emergency Drugs available, Suction available and Patient being monitored Patient Re-evaluated:Patient Re-evaluated prior to inductionOxygen Delivery Method: Circle system utilized Preoxygenation: Pre-oxygenation with 100% oxygen Intubation Type: IV induction Ventilation: Mask ventilation without difficulty Laryngoscope Size: Miller and 2 Grade View: Grade II Tube type: Oral Tube size: 7.0 mm Number of attempts: 1 Airway Equipment and Method: Stylet Placement Confirmation: ETT inserted through vocal cords under direct vision,  positive ETCO2 and breath sounds checked- equal and bilateral Secured at: 21 cm Tube secured with: Tape Dental Injury: Teeth and Oropharynx as per pre-operative assessment

## 2013-06-27 NOTE — Anesthesia Postprocedure Evaluation (Signed)
Anesthesia Post Note  Patient: Maria Dougherty  Procedure(s) Performed: Procedure(s) (LRB): LAPAROSCOPIC INCISIONAL HERNIA (N/A) INSERTION OF MESH (N/A)  Anesthesia type: General  Patient location: PACU  Post pain: Pain level controlled  Post assessment: Patient's Cardiovascular Status Stable  Last Vitals:  Filed Vitals:   06/27/13 1404  BP:   Pulse: 58  Temp:   Resp: 14    Post vital signs: Reviewed and stable  Level of consciousness: alert  Complications: No apparent anesthesia complications

## 2013-06-27 NOTE — Op Note (Signed)
LAPAROSCOPIC INCISIONAL HERNIA, INSERTION OF MESH  Procedure Note  Maria Dougherty 06/27/2013   Pre-op Diagnosis: INCISIONAL HERNIA     Post-op Diagnosis: same  Procedure(s): LAPAROSCOPIC INCISIONAL HERNIA INSERTION OF MESH (11.5cm round Bard Ventralite)  Surgeon(s): Shelly Rubenstein, MD  Anesthesia: General  Staff:  Circulator: Pauletta Browns, RN Relief Circulator: Alesia Richards, RN Scrub Person: Leighton Parody, CST; Maureen Ralphs, RN  Estimated Blood Loss: Minimal                         Kashmere Staffa A   Date: 06/27/2013  Time: 1:31 PM

## 2013-06-27 NOTE — Anesthesia Preprocedure Evaluation (Addendum)
Anesthesia Evaluation  Patient identified by MRN, date of birth, ID band Patient awake    Reviewed: Allergy & Precautions, H&P , NPO status , Patient's Chart, lab work & pertinent test results  History of Anesthesia Complications Negative for: history of anesthetic complications  Airway Mallampati: II TM Distance: >3 FB Neck ROM: full    Dental  (+) Teeth Intact and Dental Advisory Given   Pulmonary Current Smoker,          Cardiovascular hypertension,     Neuro/Psych PSYCHIATRIC DISORDERS Depression    GI/Hepatic negative GI ROS, Neg liver ROS,   Endo/Other  negative endocrine ROS  Renal/GU negative Renal ROS     Musculoskeletal negative musculoskeletal ROS (+)   Abdominal   Peds  Hematology negative hematology ROS (+)   Anesthesia Other Findings   Reproductive/Obstetrics                          Anesthesia Physical Anesthesia Plan  ASA: II  Anesthesia Plan: General   Post-op Pain Management:    Induction: Intravenous  Airway Management Planned: Oral ETT  Additional Equipment:   Intra-op Plan:   Post-operative Plan: Extubation in OR  Informed Consent: I have reviewed the patients History and Physical, chart, labs and discussed the procedure including the risks, benefits and alternatives for the proposed anesthesia with the patient or authorized representative who has indicated his/her understanding and acceptance.     Plan Discussed with: CRNA, Anesthesiologist and Surgeon  Anesthesia Plan Comments:         Anesthesia Quick Evaluation

## 2013-06-27 NOTE — Transfer of Care (Signed)
Immediate Anesthesia Transfer of Care Note  Patient: Maria Dougherty  Procedure(s) Performed: Procedure(s): LAPAROSCOPIC INCISIONAL HERNIA (N/A) INSERTION OF MESH (N/A)  Patient Location: PACU  Anesthesia Type:General  Level of Consciousness: awake, alert , oriented and patient cooperative  Airway & Oxygen Therapy: Patient Spontanous Breathing and Patient connected to nasal cannula oxygen  Post-op Assessment: Report given to PACU RN and Post -op Vital signs reviewed and stable  Post vital signs: Reviewed and stable  Complications: No apparent anesthesia complications

## 2013-06-27 NOTE — Interval H&P Note (Signed)
History and Physical Interval Note: no change in H and P  06/27/2013 10:58 AM  Maria Dougherty  has presented today for surgery, with the diagnosis of incisional hernia   The various methods of treatment have been discussed with the patient and family. After consideration of risks, benefits and other options for treatment, the patient has consented to  Procedure(s): LAPAROSCOPIC INCISIONAL HERNIA (N/A) INSERTION OF MESH (N/A) as a surgical intervention .  The patient's history has been reviewed, patient examined, no change in status, stable for surgery.  I have reviewed the patient's chart and labs.  Questions were answered to the patient's satisfaction.     [name]

## 2013-06-27 NOTE — Preoperative (Signed)
Beta Blockers   Reason not to administer Beta Blockers:Not Applicable 

## 2013-06-28 ENCOUNTER — Encounter (HOSPITAL_COMMUNITY): Payer: Self-pay | Admitting: Surgery

## 2013-06-28 LAB — CBC
HCT: 32.7 % — ABNORMAL LOW (ref 36.0–46.0)
MCHC: 34.6 g/dL (ref 30.0–36.0)
MCV: 88.9 fL (ref 78.0–100.0)
RDW: 12.6 % (ref 11.5–15.5)
WBC: 10.6 10*3/uL — ABNORMAL HIGH (ref 4.0–10.5)

## 2013-06-28 NOTE — Progress Notes (Signed)
1 Day Post-Op  Subjective: POD#1 Still with moderate pain Difficulty passing urine  Objective: Vital signs in last 24 hours: Temp:  [97.6 F (36.4 C)-98.6 F (37 C)] 98.6 F (37 C) (08/12 0529) Pulse Rate:  [51-70] 52 (08/12 0529) Resp:  [11-25] 18 (08/12 0529) BP: (91-138)/(40-73) 91/43 mmHg (08/12 0529) SpO2:  [90 %-100 %] 93 % (08/12 0529) Weight:  [159 lb 6.3 oz (72.3 kg)] 159 lb 6.3 oz (72.3 kg) (08/11 1500) Last BM Date: 06/26/13  Intake/Output from previous day: 08/11 0701 - 08/12 0700 In: 1000 [I.V.:1000] Out: 675 [Urine:625; Blood:50] Intake/Output this shift:    Actually looks comfortable Abdomen soft, minimally tender  Lab Results:   Recent Labs  06/28/13 0447  WBC 10.6*  HGB 11.3*  HCT 32.7*  PLT 302   BMET No results found for this basename: NA, K, CL, CO2, GLUCOSE, BUN, CREATININE, CALCIUM,  in the last 72 hours PT/INR No results found for this basename: LABPROT, INR,  in the last 72 hours ABG No results found for this basename: PHART, PCO2, PO2, HCO3,  in the last 72 hours  Studies/Results: No results found.  Anti-infectives: Anti-infectives   Start     Dose/Rate Route Frequency Ordered Stop   06/27/13 0600  ceFAZolin (ANCEF) IVPB 2 g/50 mL premix     2 g 100 mL/hr over 30 Minutes Intravenous On call to O.R. 06/26/13 1318 06/27/13 1207      Assessment/Plan: s/p Procedure(s): LAPAROSCOPIC INCISIONAL HERNIA (N/A) INSERTION OF MESH (N/A)  Continue monitoring in hospital.  Pain control Discharge later today vs in the morning  LOS: 1 day    Shameka Aggarwal A 06/28/2013

## 2013-06-28 NOTE — Op Note (Signed)
NAMENASHIKA, COKER NO.:  [id number]  MEDICAL RECORD NO.:  [id number]  LOCATION:  6N09C                        FACILITY:  [facility]  PHYSICIAN:  Abigail Miyamoto, M.D. DATE OF BIRTH:  [birth year]  DATE OF PROCEDURE:  06/27/2013 DATE OF DISCHARGE:                              OPERATIVE REPORT   PREOPERATIVE DIAGNOSIS:  Incisional hernia.  POSTOPERATIVE DIAGNOSIS:  Incisional hernia.  PROCEDURE:  Laparoscopic incisional hernia repair with mesh.  SURGEON:  Abigail Miyamoto, M.D.  ANESTHESIA:  General endotracheal anesthesia.  ESTIMATED BLOOD LOSS:  Minimal.  FINDINGS:  The patient had a large amount of adhesions with transverse colon stuck to the patient's previously placed mesh.  Her previously placed mesh was intact.  She developed a hernia at the lower midline of her incision.  PROCEDURE IN DETAIL:  The patient was brought to the operating room, identified as Judy Pimple.  She was placed supine on the operating room table, where general anesthesia was induced.  Her abdomen was then prepped and draped in the usual sterile fashion.  I made a small incision in the left upper quadrant through a previous scar.  I then used the 5 mm trocar and 5 mm Optiview camera to slowly traverse all levels of the abdominal wall under direct vision and gain entrance into the peritoneal cavity.  Insufflation of the abdomen was then begun.  I placed another 5 mm trocar in the patient's left lower quadrant.  I examined the abdomen and the patient was found to have a large amount of adhesions to the midline at the previous mesh.  I took these adhesions down with both sharp and blunt dissection.  I got to an area of transverse colon which was completely tethered to the mesh.  This was quite difficult to dissect free, but I appeared to get the colon off the mesh without entering the bowel lumen.  I examined the bowel significantly and saw no evidence of bowel injury.  Once I  was able to take down the rest of the adhesions, I identified a hernia defect in the lower midline below the previously placed mesh.  The previously placed mesh was then tacked covering the previous hernia defect.  It was at the incision above the umbilicus.  At this point, a 11.5 cm round Bard ventral light patch was brought onto the field.  I placed 2 stay sutures in the mesh and then rolled the mesh up and placed to an 11 mm trocar that I placed in the right upper quadrant under direct vision.  I enrolled the mesh.  I made 2 separate skin incisions and using the suture passer to pull the sutures up to the abdominal wall.  I then tied these in place, fixating the mesh to the abdominal wall.  I then tacked the mesh in circumferentially to the peritoneum around the fascial defect with the secure strap absorbable tacker.  Wide greater than 4 cm coverage appeared to be achieved circumferentially around the hernia defect.  At this point, I again extensively examined the patient's bowel including large intestines and saw no evidence of bowel injury.  I irrigated the abdomen with saline.  I again examined the bowel and saw  no evidence of injury.  At this point, all ports removed under direct vision and the abdomen was deflated.  All incisions were anesthetized with Marcaine and closed with 4-0 Monocryl subcuticular sutures.  Steri-Strips and Band- Aids were then applied.  The patient tolerated the procedure well.  All the counts were correct at the end of the procedure.  The patient was then extubated in the operating room and taken in stable condition to recovery room.     Abigail Miyamoto, M.D.     DB/MEDQ  D:  06/27/2013  T:  06/28/2013  Job:  [id number]

## 2013-06-29 LAB — URINE MICROSCOPIC-ADD ON

## 2013-06-29 LAB — URINALYSIS, ROUTINE W REFLEX MICROSCOPIC
Bilirubin Urine: NEGATIVE
Specific Gravity, Urine: 1.016 (ref 1.005–1.030)
Urobilinogen, UA: 0.2 mg/dL (ref 0.0–1.0)

## 2013-06-29 MED ORDER — HYDROMORPHONE HCL 2 MG PO TABS
4.0000 mg | ORAL_TABLET | ORAL | Status: DC | PRN
  Administered 2013-06-29 – 2013-07-01 (×4): 4 mg via ORAL
  Filled 2013-06-29 (×4): qty 2

## 2013-06-29 NOTE — Progress Notes (Signed)
UR completed 

## 2013-06-29 NOTE — Progress Notes (Signed)
2 Days Post-Op  Subjective: Still having urinary retention needing intermittent cath  Objective: Vital signs in last 24 hours: Temp:  [97.9 F (36.6 C)-98.9 F (37.2 C)] 98.9 F (37.2 C) (08/13 0548) Pulse Rate:  [62-68] 67 (08/13 0548) Resp:  [17-19] 18 (08/13 0548) BP: (110-120)/(42-58) 120/56 mmHg (08/13 0548) SpO2:  [91 %-98 %] 91 % (08/13 0548) Last BM Date: 06/26/13  Intake/Output from previous day: 08/12 0701 - 08/13 0700 In: 720 [P.O.:720] Out: 1025 [Urine:1025] Intake/Output this shift:   Comfortable in appearance Abdomen soft, appropriately tender (mild)  Lab Results:   Recent Labs  06/28/13 0447  WBC 10.6*  HGB 11.3*  HCT 32.7*  PLT 302   BMET No results found for this basename: NA, K, CL, CO2, GLUCOSE, BUN, CREATININE, CALCIUM,  in the last 72 hours PT/INR No results found for this basename: LABPROT, INR,  in the last 72 hours ABG No results found for this basename: PHART, PCO2, PO2, HCO3,  in the last 72 hours  Studies/Results: No results found.  Anti-infectives: Anti-infectives   Start     Dose/Rate Route Frequency Ordered Stop   06/27/13 0600  ceFAZolin (ANCEF) IVPB 2 g/50 mL premix     2 g 100 mL/hr over 30 Minutes Intravenous On call to O.R. 06/26/13 1318 06/27/13 1207      Assessment/Plan: s/p Procedure(s): LAPAROSCOPIC INCISIONAL HERNIA (N/Dougherty) INSERTION OF MESH (N/Dougherty)  Post op urinary retention.  Will check U/Dougherty.  Will not discharged until this improves  LOS: 2 days    Maria Dougherty 06/29/2013

## 2013-06-30 MED ORDER — CIPROFLOXACIN HCL 500 MG PO TABS: 500.0000 mg | ORAL_TABLET | Freq: Two times a day (BID) | ORAL | Status: DC

## 2013-06-30 MED ORDER — HYDROMORPHONE HCL 4 MG PO TABS: 4.0000 mg | ORAL_TABLET | ORAL | Status: DC | PRN

## 2013-06-30 MED ORDER — TAMSULOSIN HCL 0.4 MG PO CAPS
0.4000 mg | ORAL_CAPSULE | Freq: Every day | ORAL | Status: DC
  Administered 2013-06-30 – 2013-07-01 (×2): 0.4 mg via ORAL
  Filled 2013-06-30 (×2): qty 1

## 2013-06-30 MED ORDER — POLYETHYLENE GLYCOL 3350 17 G PO PACK
17.0000 g | PACK | Freq: Every day | ORAL | Status: DC
  Administered 2013-07-01 (×2): 17 g via ORAL
  Filled 2013-06-30 (×2): qty 1

## 2013-06-30 MED ORDER — ACETAMINOPHEN 325 MG PO TABS
650.0000 mg | ORAL_TABLET | Freq: Four times a day (QID) | ORAL | Status: DC | PRN
  Filled 2013-06-30: qty 2

## 2013-06-30 NOTE — Discharge Summary (Signed)
Physician Discharge Summary  Patient ID: Maria Dougherty MRN: [id number] DOB/AGE: [birth year] 57 y.o.  Admit date: 06/27/2013 Discharge date: 06/30/2013  Admission Diagnoses:  Discharge Diagnoses:  Active Problems:   * No active hospital problems. * incisional hernia Urinary retention UTI  Discharged Condition: good  Hospital Course: admitted s/p lap ventral hernia repair with mesh.  Developed post op urinary retention requiring intermittent cath. Improved by POD#3.  U/A suggested UTI.  Tolerating oral pain meds and diet so discharged home  Consults: None  Significant Diagnostic Studies:   Treatments: surgery: laparoscopic incisional hernia repair with mesh  Discharge Exam: Blood pressure 124/55, pulse 81, temperature 101.9 F (38.8 C), temperature source Oral, resp. rate 18, height 5\' 3"  (1.6 m), weight 159 lb 6.3 oz (72.3 kg), SpO2 95.00%. General appearance: alert, cooperative and no distress Incision/Wound: abdomen soft, incisions clean  Disposition: 01-Home or Self Care   Future Appointments Provider Department Dept Phone   07/11/2013 3:10 PM Shelly Rubenstein, MD [facility], [place] [phone]       Medication List         ARIPiprazole 5 MG tablet  Commonly known as:  ABILIFY  Take 5 mg by mouth at bedtime.     ciprofloxacin 500 MG tablet  Commonly known as:  CIPRO  Take 1 tablet (500 mg total) by mouth 2 (two) times daily.     HYDROmorphone 4 MG tablet  Commonly known as:  DILAUDID  Take 1 tablet (4 mg total) by mouth every 4 (four) hours as needed.     traZODone 150 MG tablet  Commonly known as:  DESYREL  Take 300 mg by mouth at bedtime.           Follow-up Information   Follow up with Shelly Rubenstein, MD On 07/11/2013.   Specialty:  General Surgery   Contact information:   [address] [place] [address] [phone]       Signed: Shelly Rubenstein 06/30/2013, 6:33 AM

## 2013-06-30 NOTE — Progress Notes (Signed)
3 Days Post-Op  Subjective: Feeling much better.  Voiding well Minimal abdominal pain  Objective: Vital signs in last 24 hours: Temp:  [99 F (37.2 C)-101.9 F (38.8 C)] 101.9 F (38.8 C) (08/14 0539) Pulse Rate:  [72-81] 81 (08/14 0539) Resp:  [15-18] 18 (08/14 0539) BP: (113-128)/(47-56) 124/55 mmHg (08/14 0539) SpO2:  [72 %-95 %] 95 % (08/14 0539) Last BM Date: 06/26/13  Intake/Output from previous day: 08/13 0701 - 08/14 0700 In: -  Out: 1575 [Urine:1575] Intake/Output this shift: Total I/O In: -  Out: 600 [Urine:600]  Comfortable Abdomen soft  Lab Results:   Recent Labs  06/28/13 0447  WBC 10.6*  HGB 11.3*  HCT 32.7*  PLT 302   BMET No results found for this basename: NA, K, CL, CO2, GLUCOSE, BUN, CREATININE, CALCIUM,  in the last 72 hours PT/INR No results found for this basename: LABPROT, INR,  in the last 72 hours ABG No results found for this basename: PHART, PCO2, PO2, HCO3,  in the last 72 hours  Studies/Results: No results found.  Anti-infectives: Anti-infectives   Start     Dose/Rate Route Frequency Ordered Stop   06/27/13 0600  ceFAZolin (ANCEF) IVPB 2 g/50 mL premix     2 g 100 mL/hr over 30 Minutes Intravenous On call to O.R. 06/26/13 1318 06/27/13 1207      Assessment/Plan: s/p Procedure(s): LAPAROSCOPIC INCISIONAL HERNIA (N/Dougherty) INSERTION OF MESH (N/Dougherty)  U/Dougherty suggests UTI.  Will discharge home on Cipro Discharge today  LOS: 3 days    Maria Dougherty 06/30/2013

## 2013-07-01 MED ORDER — TAMSULOSIN HCL 0.4 MG PO CAPS: 0.4000 mg | ORAL_CAPSULE | Freq: Every day | ORAL | Status: DC

## 2013-07-01 NOTE — Progress Notes (Signed)
Patient ID: Maria Dougherty, female   DOB: [birth year], 57 y.o.   MRN: [id number]  Discharge held for 24 hours to resolve urine issues  Voiding better  Discharge home

## 2013-07-02 LAB — URINE CULTURE

## 2013-07-11 ENCOUNTER — Ambulatory Visit (INDEPENDENT_AMBULATORY_CARE_PROVIDER_SITE_OTHER): Payer: 59 | Admitting: Surgery

## 2013-07-11 ENCOUNTER — Encounter (INDEPENDENT_AMBULATORY_CARE_PROVIDER_SITE_OTHER): Payer: Self-pay | Admitting: Surgery

## 2013-07-11 VITALS — BP 120/80 | HR 64 | Temp 98.3°F | Resp 14 | Ht 63.5 in | Wt 149.4 lb

## 2013-07-11 DIAGNOSIS — Z09 Encounter for follow-up examination after completed treatment for conditions other than malignant neoplasm: Secondary | ICD-10-CM

## 2013-07-11 NOTE — Progress Notes (Signed)
Subjective:     Patient ID: Maria Dougherty, female   DOB: May 09, 1956, 57 y.o.   MRN: [id number]  HPI She is here for her first postop visit status post laparoscopic incisional hernia repair with mesh. She developed urinary retention and a urinary tract infection postoperatively.  She is now voiding well and moving her bowels well. She still has intermittent discomfort  Review of Systems     Objective:   Physical Exam On exam, her incisions are healing well her abdomen is soft without evidence of recurrent hernia    Assessment:     Patient stable postop     Plan:     She will refrain from lifting and work until September 15. At that time she may return to normal activity. She will finish her course of antibiotics. She will call should she have any problems.

## 2013-09-09 IMAGING — CT CT ABD-PELV W/ CM
1 of 5 series · 14 of 36 positions shown, 19 images · IV contrast (READICAT/WATER & [ID] OMNI 300)
Comparison: Abdominal radiographs 04/11/2010.  Abdominal pelvic CT
04/08/2010.

CLINICAL DATA: Diffuse abdominal pain with cramping, diarrhea and
weight loss.  History of partial hysterectomy and small bowel
obstruction..

CT ABDOMEN AND PELVIS WITH CONTRAST
TECHNIQUE: Multidetector CT imaging of the abdomen and pelvis was
performed following the standard protocol during bolus
administration of intravenous contrast.
Contrast: 100mL OMNIPAQUE IOHEXOL 300 MG/ML  SOLN

[Series 2: abd/pelvis with · axial · 0.66mm/px · z∈[-381,+19]mm · 14 of 92 slices shown, 19 images]
[im 6/92  soft-tissue]
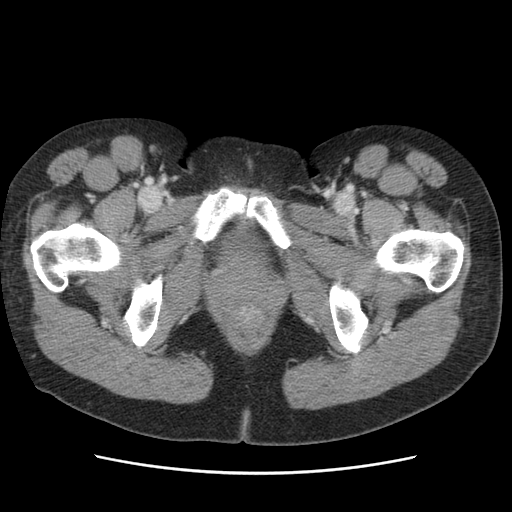
[im 6/92  bone]
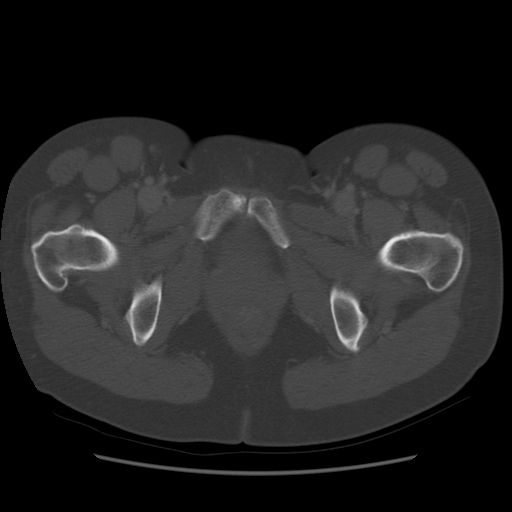
[im 11/92  soft-tissue]
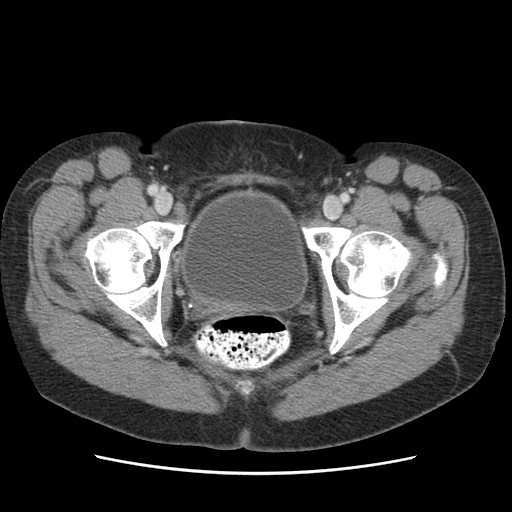
[im 21/92  soft-tissue]
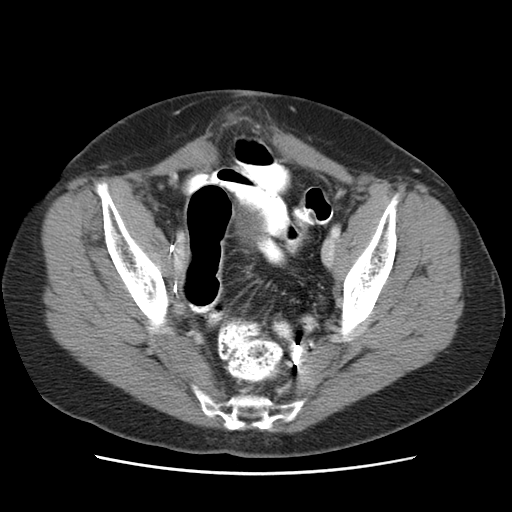
[im 26/92  soft-tissue]
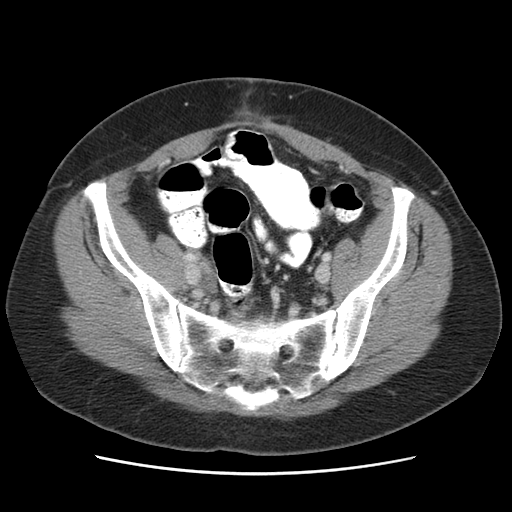
[im 31/92  soft-tissue]
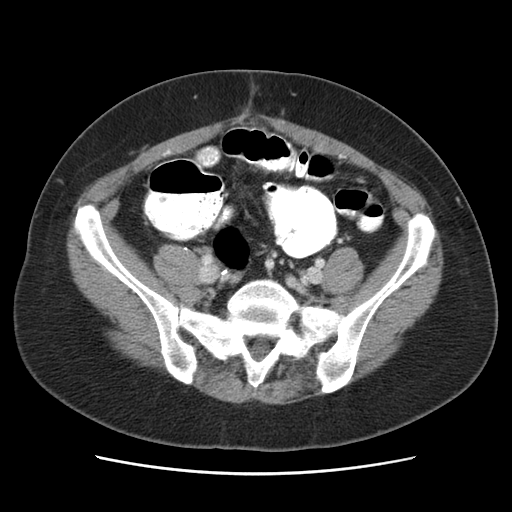
[im 41/92  soft-tissue]
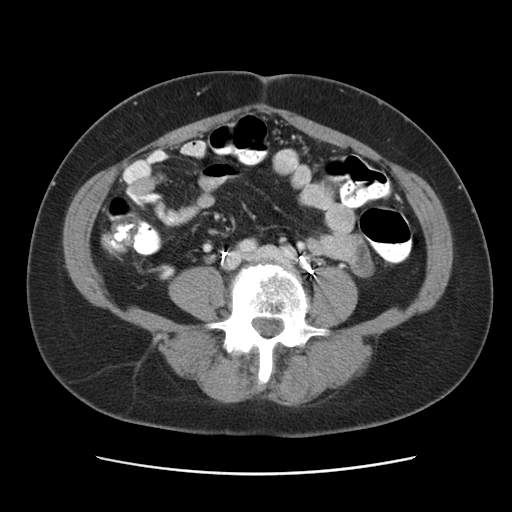
[im 46/92  soft-tissue]
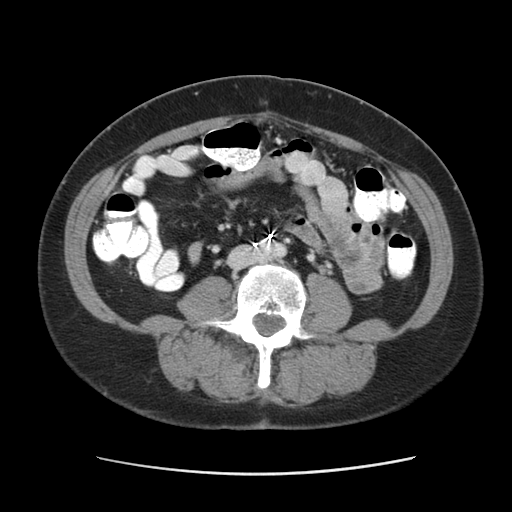
[im 51/92  soft-tissue]
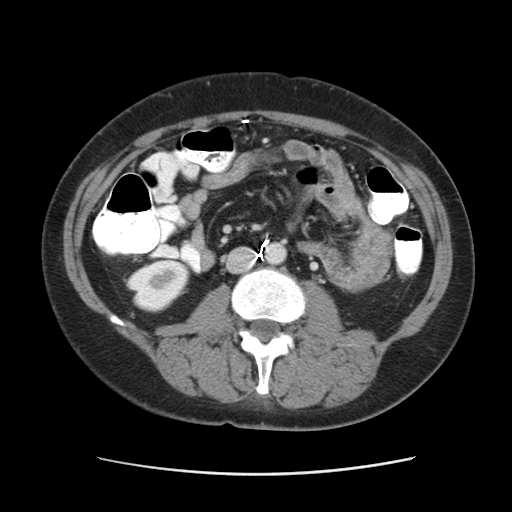
[im 61/92  soft-tissue]
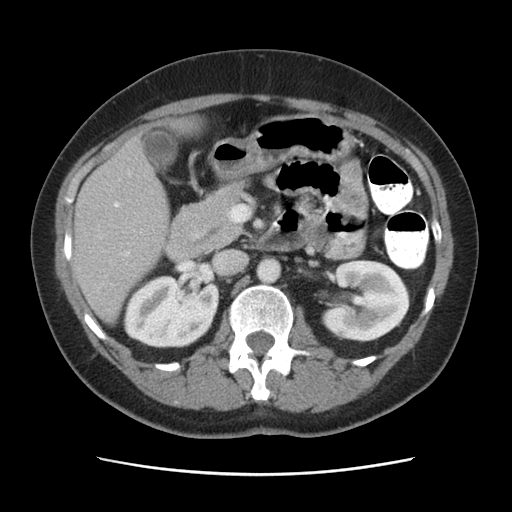
[im 61/92  bone]
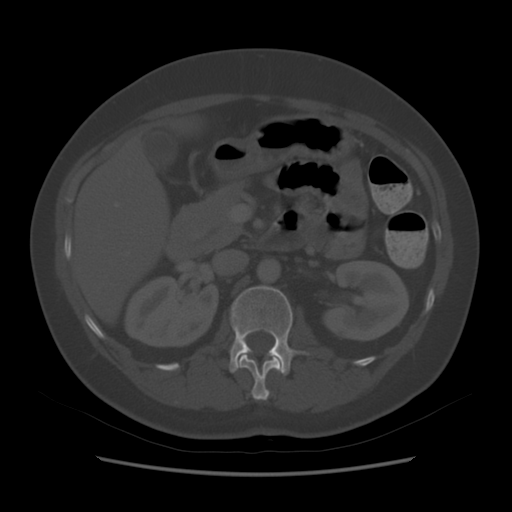
[im 66/92  soft-tissue]
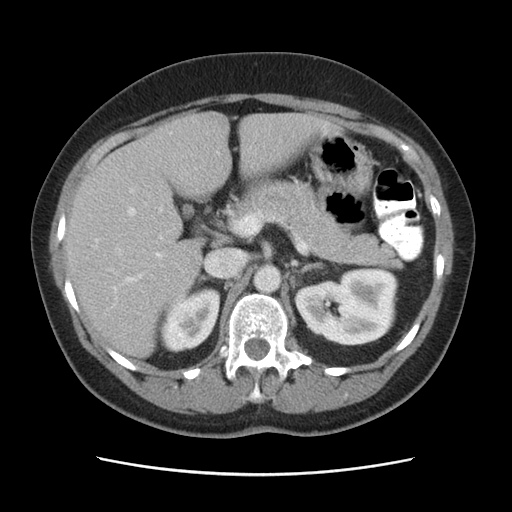
[im 71/92  soft-tissue]
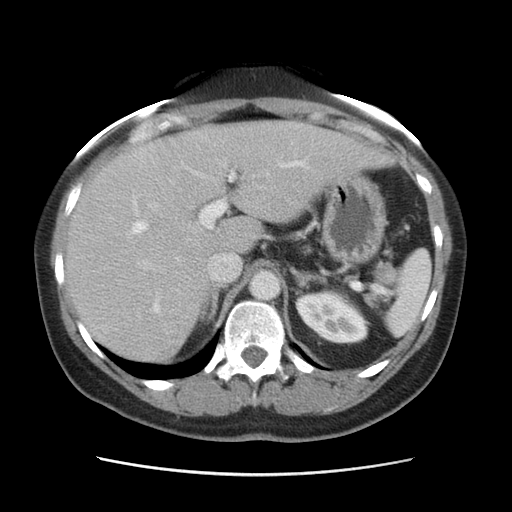
[im 71/92  lung]
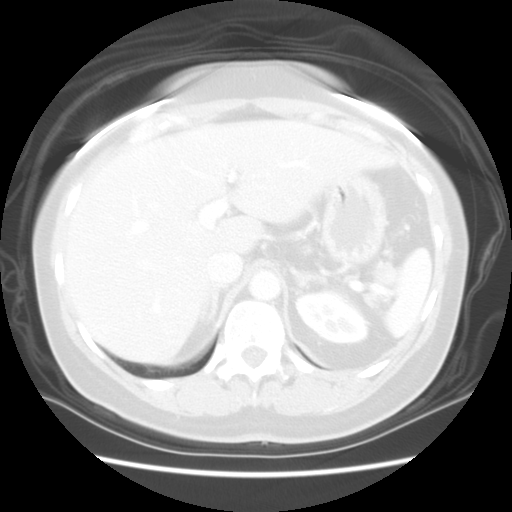
[im 76/92  lung]
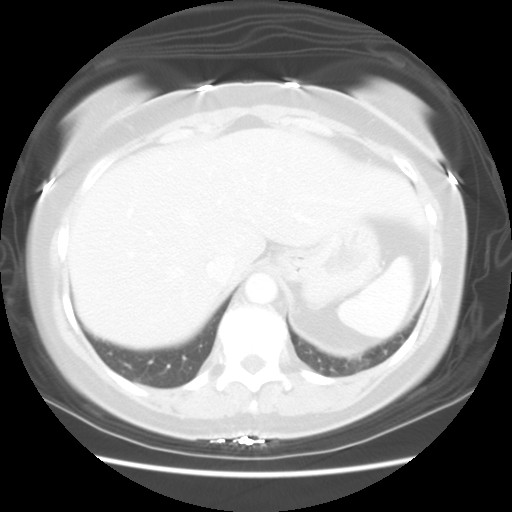
[im 81/92  soft-tissue]
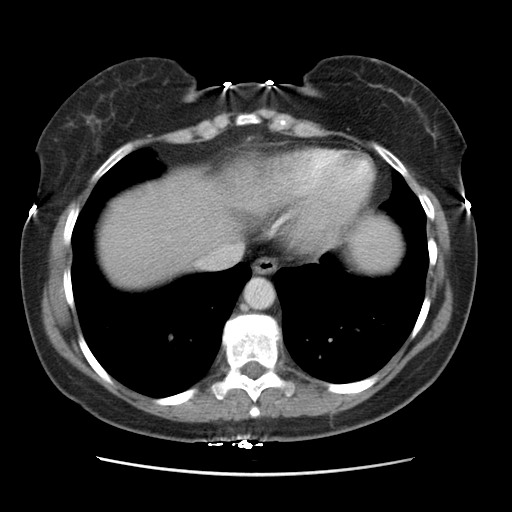
[im 81/92  lung]
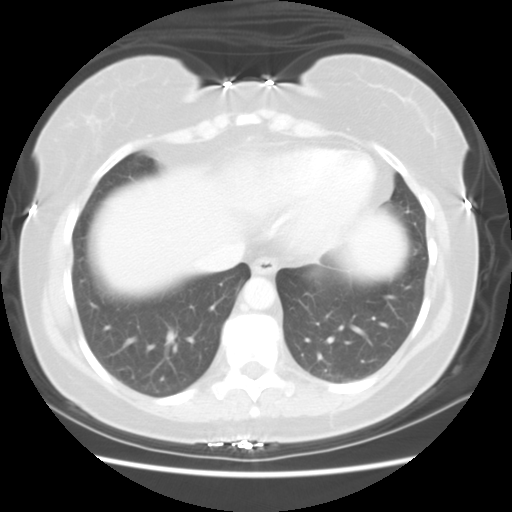
[im 86/92  soft-tissue]
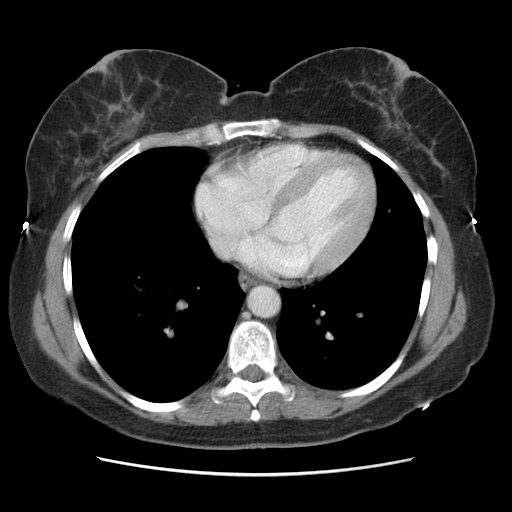
[im 86/92  lung]
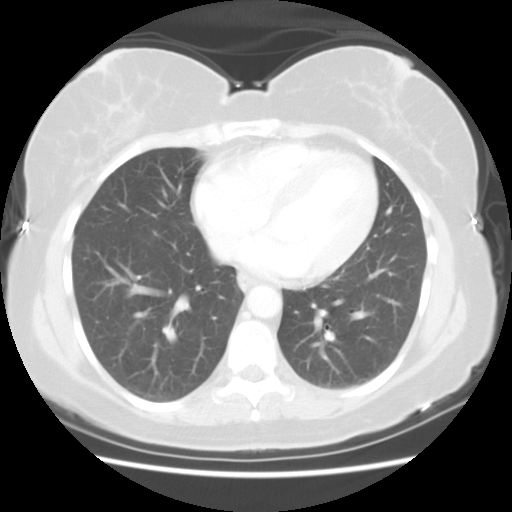

[14 of 36 positions shown; findings below may reference images not displayed]

FINDINGS: There is stable focal scarring at the left lung base on
image number 10.  Tiny subpleural nodular densities in both lungs
were not previously imaged (images 1 and 5), but are unlikely to be
clinically significant.  There is no pleural effusion.

The liver, gallbladder, biliary system and pancreas appear normal.
The spleen and adrenal glands appear normal.  There are bilateral
extrarenal pelves without delay in contrast excretion. A small low-
density lesion in the lower pole of the left kidney on image 33 is
stable.  The kidneys otherwise appear normal.

Multiple retroperitoneal surgical clips are noted.  There is no
recurrent lymphadenopathy.  There is no recurrent bowel distension,
ascites or peritoneal nodularity.  Small retroperitoneal
phleboliths are noted.  There is no adnexal mass status post
hysterectomy.  The bladder appears normal.  Postsurgical changes
are present within the anterior abdominal wall without focal
hernia.

There is progressive spondylosis at L3-L4 where there is disc space
loss, mild chronic endplate irregularity and a grade 1
anterolisthesis.  There is no paraspinal fluid collection or gross
bone destruction.
IMPRESSION: 1.  No acute abdominal pelvic findings status post retroperitoneal
lymph node dissection.  No recurrent small bowel obstruction.
2.  No evidence of metastatic disease.
3.  Progressive degenerative disc disease at L3-L4

## 2013-09-22 ENCOUNTER — Other Ambulatory Visit: Payer: Self-pay

## 2014-12-13 ENCOUNTER — Encounter: Payer: Self-pay | Admitting: Family Medicine

## 2014-12-13 ENCOUNTER — Ambulatory Visit (INDEPENDENT_AMBULATORY_CARE_PROVIDER_SITE_OTHER): Payer: 59 | Admitting: Family Medicine

## 2014-12-13 VITALS — BP 128/80 | HR 78 | Temp 98.2°F | Wt 168.0 lb

## 2014-12-13 DIAGNOSIS — J029 Acute pharyngitis, unspecified: Secondary | ICD-10-CM

## 2014-12-13 LAB — POCT RAPID STREP A (OFFICE): Rapid Strep A Screen: NEGATIVE

## 2014-12-13 NOTE — Progress Notes (Signed)
   Subjective:    Patient ID: Maria Dougherty, female    DOB: 1956-03-14, 59 y.o.   MRN: [id number]  HPI  she is here for evaluation of a three-day history of right-sided throat and neck pain. No earache, fever , cough, congestion.. She also complains of frontal headache. She has had a tonsillectomy within the last several years.   Review of Systems     Objective:   Physical Exam alert and in no distress. Tympanic membranes and canals are normal. Throat is clear. Tonsils are  absent. Neck is supple with right anterior cervical minimal adenopathy adenopathy no thyromegaly. Cardiac exam shows a regular sinus rhythm without murmurs or gallops. Lungs are clear to auscultation. Strep screen negative.        Assessment & Plan:  Acute pharyngitis, unspecified pharyngitis type - Plan: Rapid Strep A   Supportive care. Call if further difficulty.

## 2015-01-02 ENCOUNTER — Ambulatory Visit (INDEPENDENT_AMBULATORY_CARE_PROVIDER_SITE_OTHER): Payer: 59 | Admitting: Family Medicine

## 2015-01-02 ENCOUNTER — Encounter: Payer: Self-pay | Admitting: Family Medicine

## 2015-01-02 VITALS — BP 120/80 | HR 84 | Ht 63.5 in | Wt 168.0 lb

## 2015-01-02 DIAGNOSIS — F17200 Nicotine dependence, unspecified, uncomplicated: Secondary | ICD-10-CM | POA: Insufficient documentation

## 2015-01-02 DIAGNOSIS — Z720 Tobacco use: Secondary | ICD-10-CM

## 2015-01-02 DIAGNOSIS — Z1239 Encounter for other screening for malignant neoplasm of breast: Secondary | ICD-10-CM

## 2015-01-02 DIAGNOSIS — N941 Dyspareunia: Secondary | ICD-10-CM

## 2015-01-02 DIAGNOSIS — F341 Dysthymic disorder: Secondary | ICD-10-CM

## 2015-01-02 DIAGNOSIS — R5382 Chronic fatigue, unspecified: Secondary | ICD-10-CM

## 2015-01-02 DIAGNOSIS — Z0000 Encounter for general adult medical examination without abnormal findings: Secondary | ICD-10-CM

## 2015-01-02 DIAGNOSIS — Z8639 Personal history of other endocrine, nutritional and metabolic disease: Secondary | ICD-10-CM

## 2015-01-02 DIAGNOSIS — IMO0002 Reserved for concepts with insufficient information to code with codable children: Secondary | ICD-10-CM

## 2015-01-02 LAB — COMPREHENSIVE METABOLIC PANEL
ALK PHOS: 89 U/L (ref 39–117)
ALT: 22 U/L (ref 0–35)
AST: 16 U/L (ref 0–37)
Albumin: 4.2 g/dL (ref 3.5–5.2)
BILIRUBIN TOTAL: 0.3 mg/dL (ref 0.2–1.2)
BUN: 14 mg/dL (ref 6–23)
CALCIUM: 9.3 mg/dL (ref 8.4–10.5)
CO2: 30 mEq/L (ref 19–32)
Chloride: 105 mEq/L (ref 96–112)
Creat: 0.86 mg/dL (ref 0.50–1.10)
GLUCOSE: 70 mg/dL (ref 70–99)
Potassium: 4.1 mEq/L (ref 3.5–5.3)
SODIUM: 141 meq/L (ref 135–145)
Total Protein: 7.1 g/dL (ref 6.0–8.3)

## 2015-01-02 LAB — CBC WITH DIFFERENTIAL/PLATELET
BASOS ABS: 0 10*3/uL (ref 0.0–0.1)
BASOS PCT: 0 % (ref 0–1)
EOS ABS: 0.1 10*3/uL (ref 0.0–0.7)
Eosinophils Relative: 2 % (ref 0–5)
HEMATOCRIT: 43.2 % (ref 36.0–46.0)
Hemoglobin: 14.5 g/dL (ref 12.0–15.0)
Lymphocytes Relative: 44 % (ref 12–46)
Lymphs Abs: 2.3 10*3/uL (ref 0.7–4.0)
MCH: 29.9 pg (ref 26.0–34.0)
MCHC: 33.6 g/dL (ref 30.0–36.0)
MCV: 89.1 fL (ref 78.0–100.0)
MPV: 9.5 fL (ref 8.6–12.4)
Monocytes Absolute: 0.4 10*3/uL (ref 0.1–1.0)
Monocytes Relative: 7 % (ref 3–12)
NEUTROS ABS: 2.4 10*3/uL (ref 1.7–7.7)
NEUTROS PCT: 47 % (ref 43–77)
PLATELETS: 414 10*3/uL — AB (ref 150–400)
RBC: 4.85 MIL/uL (ref 3.87–5.11)
RDW: 13.4 % (ref 11.5–15.5)
WBC: 5.2 10*3/uL (ref 4.0–10.5)

## 2015-01-02 LAB — POCT URINALYSIS DIPSTICK
Bilirubin, UA: NEGATIVE
Glucose, UA: NEGATIVE
Ketones, UA: NEGATIVE
Leukocytes, UA: NEGATIVE
Nitrite, UA: NEGATIVE
PROTEIN UA: NEGATIVE
RBC UA: NEGATIVE
SPEC GRAV UA: 1.025
UROBILINOGEN UA: NEGATIVE
pH, UA: 6

## 2015-01-02 LAB — T4, FREE: FREE T4: 0.98 ng/dL (ref 0.80–1.80)

## 2015-01-02 LAB — T3, FREE: T3, Free: 3.4 pg/mL (ref 2.3–4.2)

## 2015-01-02 LAB — LIPID PANEL
CHOL/HDL RATIO: 5.2 ratio
CHOLESTEROL: 292 mg/dL — AB (ref 0–200)
HDL: 56 mg/dL (ref >39)
LDL Cholesterol: 219 mg/dL — ABNORMAL HIGH (ref 0–99)
Triglycerides: 85 mg/dL (ref <150)
VLDL: 17 mg/dL (ref 0–40)

## 2015-01-02 LAB — TSH: TSH: 1.103 u[IU]/mL (ref 0.350–4.500)

## 2015-01-02 NOTE — Progress Notes (Signed)
   Subjective:    Patient ID: Maria Dougherty, female    DOB: 1956-01-28, 59 y.o.   MRN: [id number]  HPI She is here for an annual exam. She reports increased thirst, hunger and urination since past summer and an overall decrease in energy as well as weight gain and constipation.  She is concerned that she might have diabetes or a thyroid dysfunction.  Reports history of goiter, diagnosed when she lived in [place], and has taken synthroid in past. She also reports feeling like her neck and thyroid have increased in size over past year. She denies fever, chills, headaches, dizziness, shortness of breath, palpitations, chest pain, or dysphagia.   She reports feeling satisfied with life and her mood is good. She is engaged to be married and has been experiencing occasional painful intercourse. Health maintenance and immunizations reviewed. She had a hysterectomy due to cervical cancer. She does complain of occasional arthralgias .Reports healthy diet but is not physically active. She is up to date with immunizations. Family and social history reviewed. She also continues to enjoy her work. She is smoking 3 cigarettes per day, mostly after meals and is ready to stop smoking.    Review of Systems  HENT: Negative.   Respiratory: Negative for cough, shortness of breath and wheezing.   Cardiovascular: Negative.   Gastrointestinal: Positive for constipation. Negative for nausea, vomiting, abdominal pain, blood in stool and abdominal distention.       Reports having a "normal" stool every 3-4 days. Has not taken anything otc for this  Endocrine: Positive for polydipsia, polyphagia and polyuria.  Genitourinary: Positive for dyspareunia. Negative for hematuria, vaginal bleeding and vaginal discharge.       Occasional dyspareunia   Musculoskeletal: Positive for arthralgias.       Mild joint aches "from time to time". Not keeping her from her activities.   Skin: Negative.   Neurological: Negative.     Hematological: Negative.   Psychiatric/Behavioral: Negative.        Objective:   Physical Exam  Alert and in no distress.Tympanic membranes and canals are normal. Throat is clear. Tonsils are normal. Neck is supple without adenopathy. Thyroid smooth, ? thyromegaly no nodules. Cardiac exam shows a regular sinus rhythm without murmurs or gallops. Lungs are clear to auscultation. Abdomen soft, non distended, non tender, normal bowel sounds, no hepatosplenomegaly. Extremities warm, pulses 2+, DTRs 2+ except diminished Achilles tendon. Breast and pelvic exam deferred.       Assessment & Plan:  Routine general medical examination at a health care facility - Plan: POCT Urinalysis Dipstick, CBC with Differential/Platelet, Comprehensive metabolic panel, Lipid panel  History of goiter - Plan: TSH, T3, Free, T4, Free, US Soft Tissue Head/Neck  Dysthymia  Dyspareunia  Chronic fatigue - Plan: TSH, T3, Free, T4, Free  Screening for breast cancer - Plan: MM DIGITAL SCREENING BILATERAL  Current smoker  Discussed increasing bulk in her diet, increasing physical activity, hydration and listening to her body in terms of improving constipation. Encouraged her to substitute her post meal cigarettes with a healthy activity such as taking a walk, deep breathing, or chewing gum. She will let me know if she needs further assistance with smoking cessation.  Recommended use of lubricants or olive oil for dyspareunia and if this does not improve we will consider topical estrogen.  Follow-up pending lab results.

## 2015-01-05 ENCOUNTER — Other Ambulatory Visit: Payer: Self-pay

## 2015-01-05 ENCOUNTER — Ambulatory Visit
Admission: RE | Admit: 2015-01-05 | Discharge: 2015-01-05 | Disposition: A | Discharge status: Home / Self Care | Payer: 59 | Source: Ambulatory Visit | Attending: Family Medicine | Admitting: Family Medicine

## 2015-01-05 DIAGNOSIS — Z8639 Personal history of other endocrine, nutritional and metabolic disease: Secondary | ICD-10-CM

## 2015-01-09 ENCOUNTER — Telehealth: Payer: Self-pay | Admitting: Family Medicine

## 2015-01-09 NOTE — Telephone Encounter (Signed)
Pt called asking for labs results for last weeks labs

## 2015-01-09 NOTE — Telephone Encounter (Signed)
Send them to her

## 2015-01-15 ENCOUNTER — Encounter: Payer: Self-pay | Admitting: Family Medicine

## 2015-01-15 ENCOUNTER — Ambulatory Visit (INDEPENDENT_AMBULATORY_CARE_PROVIDER_SITE_OTHER): Payer: 59 | Admitting: Family Medicine

## 2015-01-15 VITALS — BP 130/80 | HR 85 | Wt 166.0 lb

## 2015-01-15 DIAGNOSIS — E785 Hyperlipidemia, unspecified: Secondary | ICD-10-CM

## 2015-01-15 MED ORDER — PRAVASTATIN SODIUM 40 MG PO TABS: 40.0000 mg | ORAL_TABLET | Freq: Every day | ORAL | Status: DC

## 2015-01-15 NOTE — Progress Notes (Signed)
   Subjective:    Patient ID: Maria Dougherty, female    DOB: January 01, 1956, 59 y.o.   MRN: [id number]  HPI She is here for consult concerning elevated cholesterol. She does not have a family history of heart disease although both parents had heart trouble in their 36s. She smokes 3 cigarettes per day. She has not had blood pressure or diabetes-related problems.   Review of Systems     Objective:   Physical Exam Alert and in no distress otherwise not examined       Assessment & Plan:  Hyperlipidemia LDL goal <100 - Plan: pravastatin (PRAVACHOL) 40 MG tablet, Comprehensive metabolic panel, Lipid panel  I discussed the risk factors of cardiovascular disease. Explained that her LDL being that high requires intervention. I will place her on Pravachol. Discussed possible side effects. She will return here in 2 months for blood work. She is to call if she has any concerns about the medication.

## 2015-03-12 ENCOUNTER — Other Ambulatory Visit: Payer: 59

## 2015-03-12 DIAGNOSIS — E785 Hyperlipidemia, unspecified: Secondary | ICD-10-CM

## 2015-03-12 LAB — COMPREHENSIVE METABOLIC PANEL
ALK PHOS: 79 U/L (ref 39–117)
ALT: 13 U/L (ref 0–35)
AST: 13 U/L (ref 0–37)
Albumin: 3.9 g/dL (ref 3.5–5.2)
BILIRUBIN TOTAL: 0.4 mg/dL (ref 0.2–1.2)
BUN: 9 mg/dL (ref 6–23)
CALCIUM: 9.3 mg/dL (ref 8.4–10.5)
CO2: 26 mEq/L (ref 19–32)
CREATININE: 0.9 mg/dL (ref 0.50–1.10)
Chloride: 105 mEq/L (ref 96–112)
Glucose, Bld: 119 mg/dL — ABNORMAL HIGH (ref 70–99)
Potassium: 3.7 mEq/L (ref 3.5–5.3)
SODIUM: 140 meq/L (ref 135–145)
Total Protein: 6.6 g/dL (ref 6.0–8.3)

## 2015-03-12 LAB — LIPID PANEL
CHOL/HDL RATIO: 6.4 ratio
CHOLESTEROL: 256 mg/dL — AB (ref 0–200)
HDL: 40 mg/dL — ABNORMAL LOW (ref >=46)
LDL CALC: 173 mg/dL — AB (ref 0–99)
TRIGLYCERIDES: 217 mg/dL — AB (ref <150)
VLDL: 43 mg/dL — ABNORMAL HIGH (ref 0–40)

## 2015-03-13 ENCOUNTER — Other Ambulatory Visit: Payer: Self-pay | Admitting: Family Medicine

## 2015-03-13 DIAGNOSIS — E785 Hyperlipidemia, unspecified: Secondary | ICD-10-CM

## 2015-03-13 MED ORDER — ATORVASTATIN CALCIUM 20 MG PO TABS: 20.0000 mg | ORAL_TABLET | Freq: Every day | ORAL | Status: AC

## 2015-09-23 IMAGING — US US SOFT TISSUE HEAD/NECK
1 series · 14 of 25 positions shown · non-contrast
Comparison: CT 06/14/2012 and earlier studies

CLINICAL DATA: Goiter.

EXAM:
THYROID ULTRASOUND
TECHNIQUE: Ultrasound examination of the thyroid gland and adjacent soft
tissues was performed.

[Series 1: us soft tissue head/neck · 0.06mm/px · 14 of 46 slices shown]
[im 1/46]
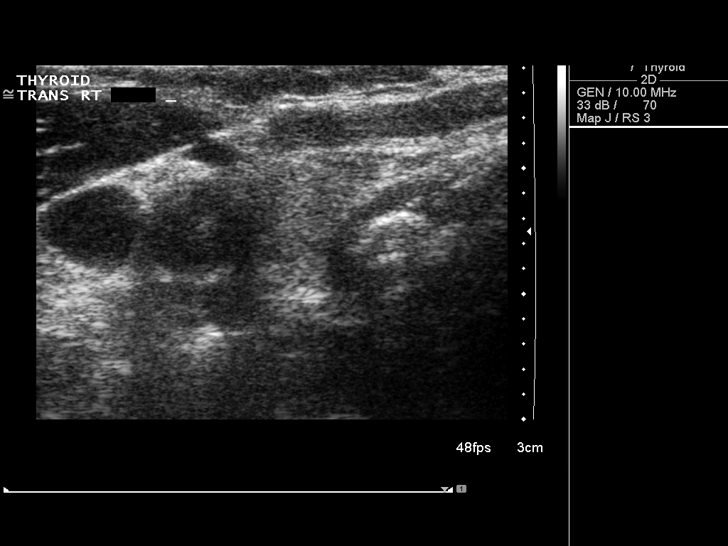
[im 4/46]
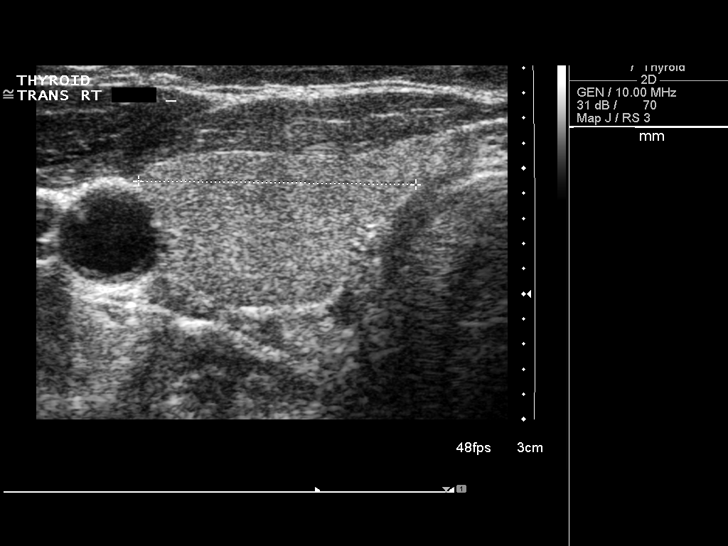
[im 8/46]
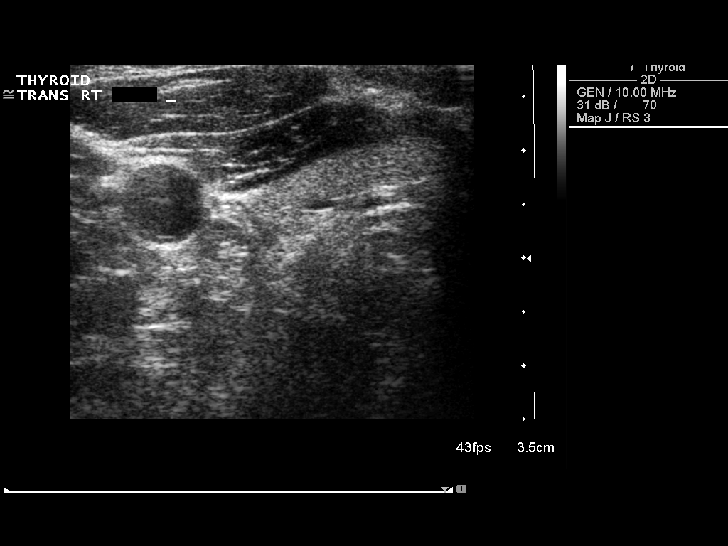
[im 12/46]
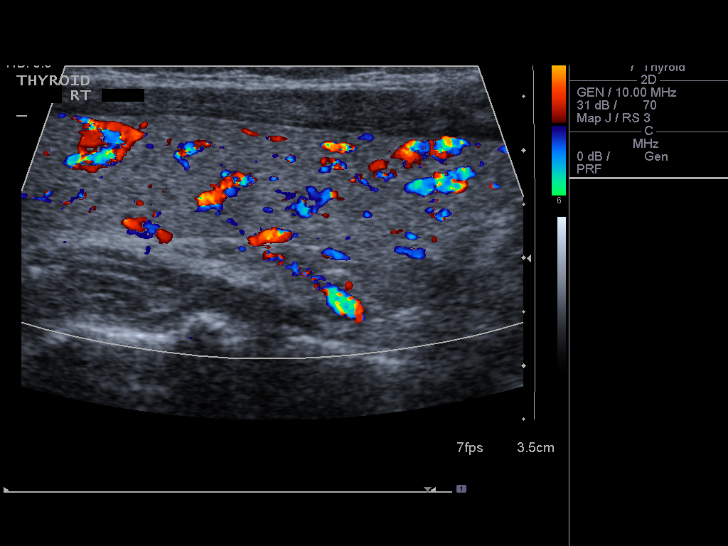
[im 16/46]
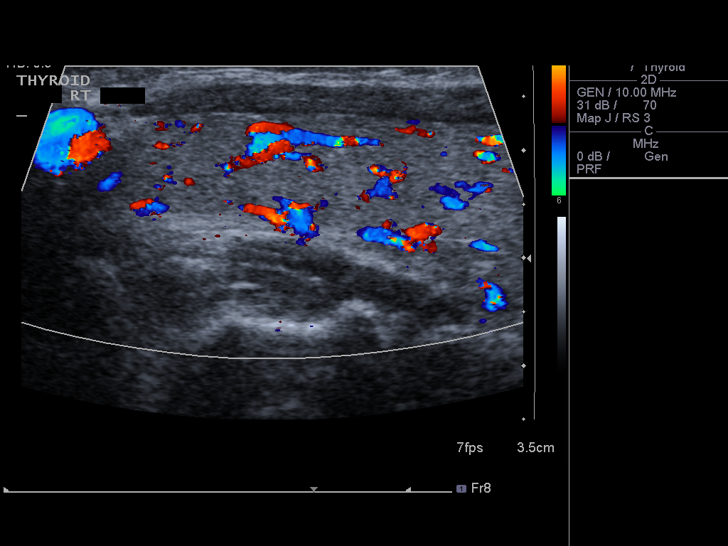
[im 17/46]
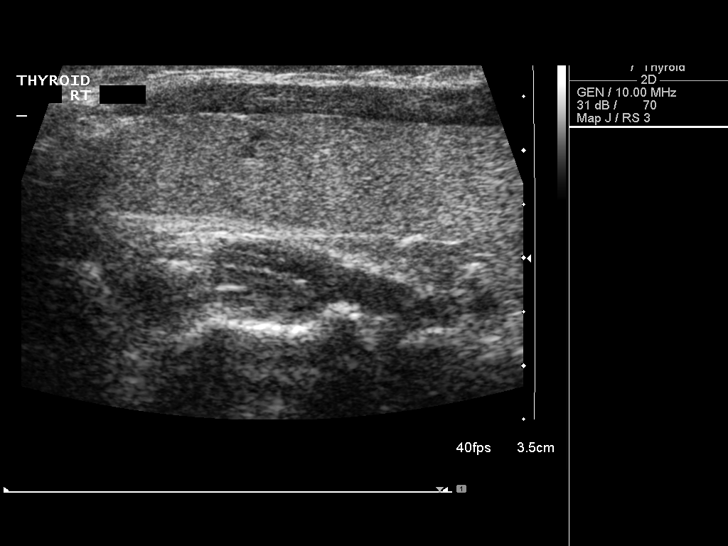
[im 21/46]
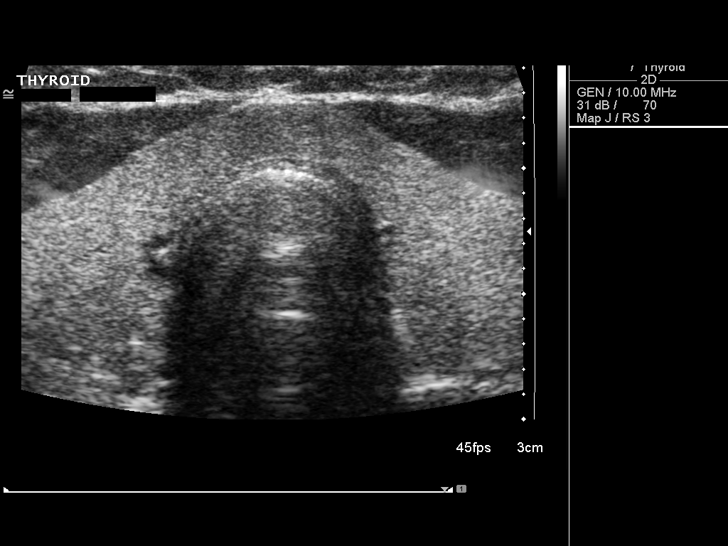
[im 25/46]
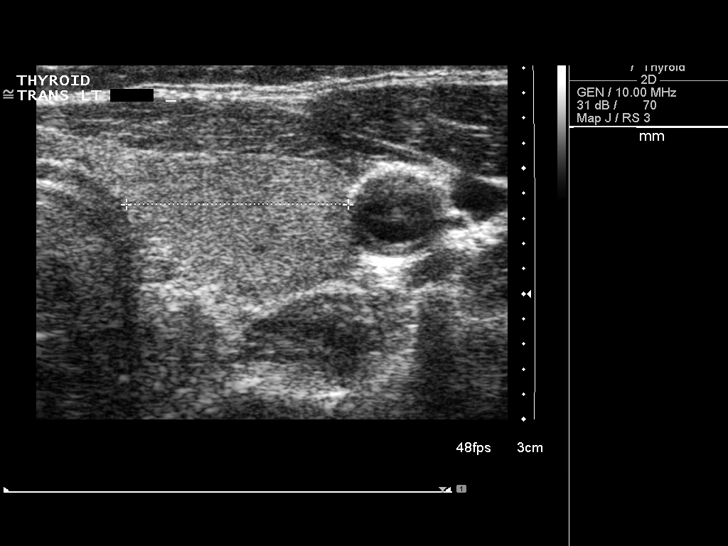
[im 29/46]
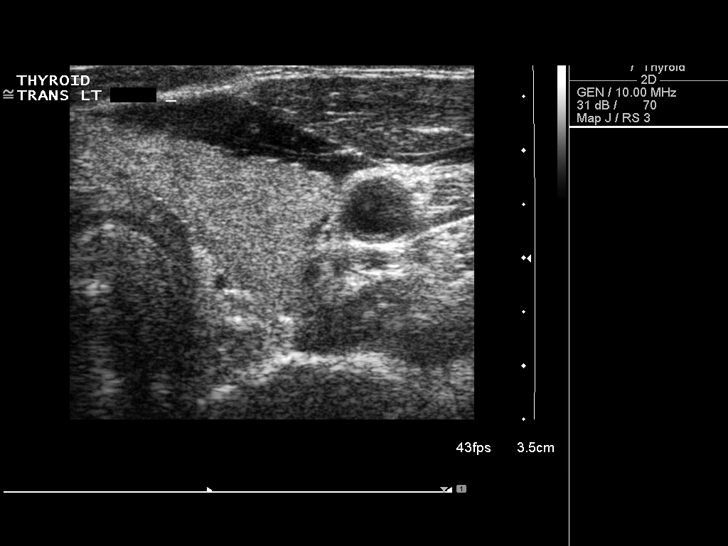
[im 31/46]
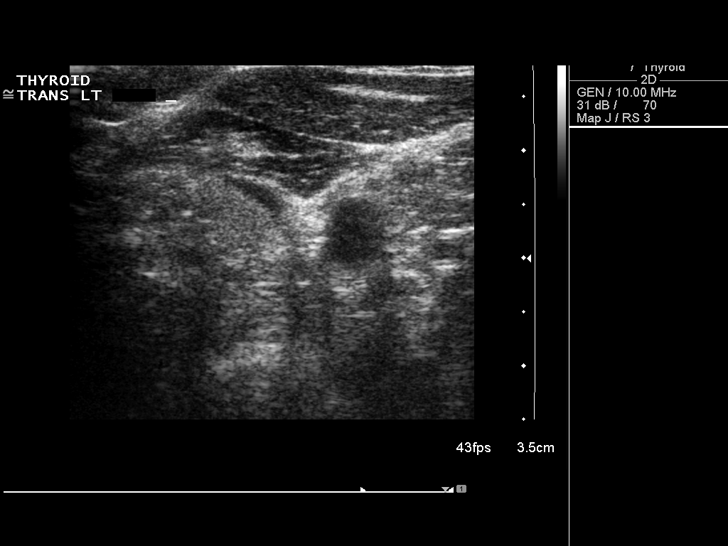
[im 34/46]
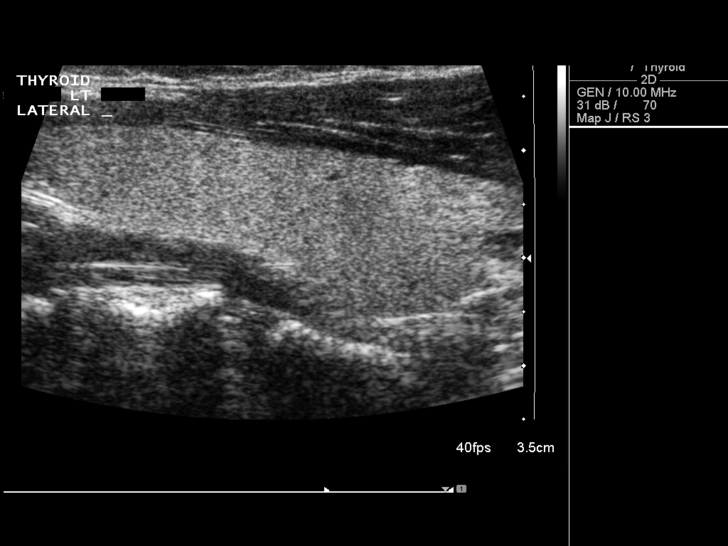
[im 38/46]
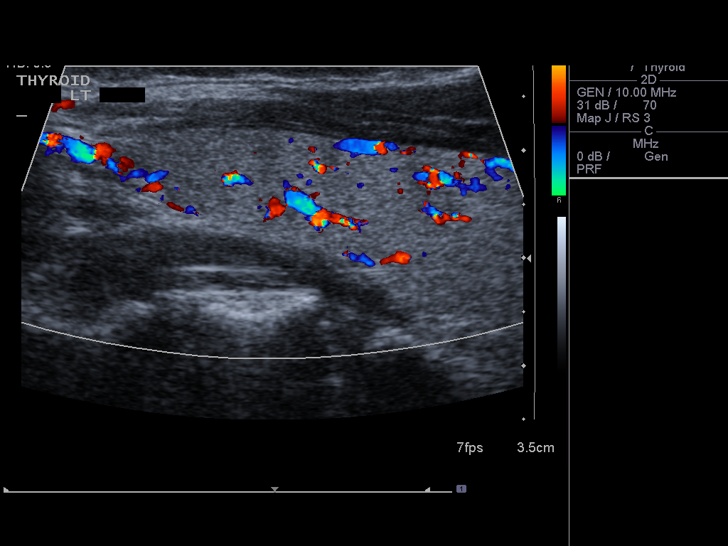
[im 42/46]
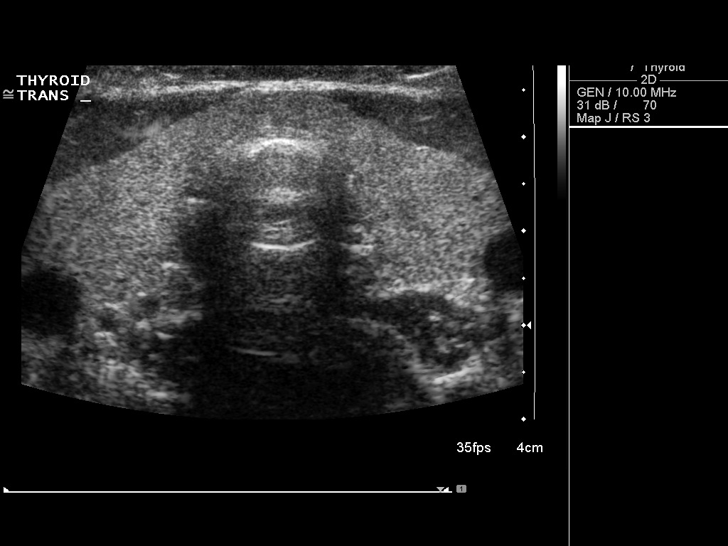
[im 46/46]
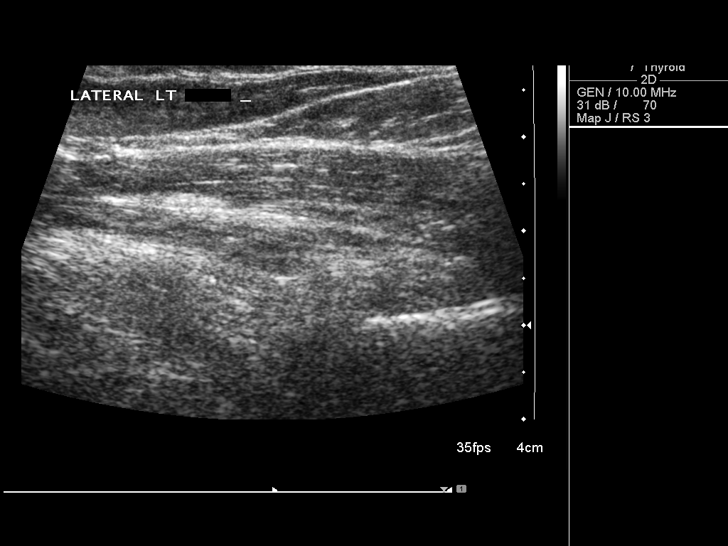

[14 of 25 positions shown; findings below may reference images not displayed]

FINDINGS: Right thyroid lobe

Measurements: 59 x 15 x 22 mm. Mildly inhomogeneous echotexture
without focal lesion. Mild hyperemia.

Left thyroid lobe

Measurements: 54 x 15 x 18 mm.  No nodules visualized.

Isthmus

Thickness: 4 mm.  No nodules visualized.

Lymphadenopathy

None visualized.
IMPRESSION: 1. Mild thyromegaly without focal lesion or nodule.

## 2023-01-28 ENCOUNTER — Encounter: Payer: Self-pay | Admitting: Gastroenterology
